# Patient Record
Sex: Female | Born: 1950 | Race: White | Hispanic: No | Marital: Single | Smoking: Never smoker
Health system: Southern US, Community
[De-identification: ages and names within clinical notes are randomized; demographics above are authoritative.]

## PROBLEM LIST (undated history)

## (undated) DIAGNOSIS — I2510 Atherosclerotic heart disease of native coronary artery without angina pectoris: Secondary | ICD-10-CM

## (undated) DIAGNOSIS — I119 Hypertensive heart disease without heart failure: Secondary | ICD-10-CM

## (undated) DIAGNOSIS — Z952 Presence of prosthetic heart valve: Secondary | ICD-10-CM

## (undated) DIAGNOSIS — I10 Essential (primary) hypertension: Secondary | ICD-10-CM

## (undated) DIAGNOSIS — I358 Other nonrheumatic aortic valve disorders: Secondary | ICD-10-CM

## (undated) DIAGNOSIS — F71 Moderate intellectual disabilities: Secondary | ICD-10-CM

## (undated) DIAGNOSIS — I359 Nonrheumatic aortic valve disorder, unspecified: Secondary | ICD-10-CM

## (undated) DIAGNOSIS — M810 Age-related osteoporosis without current pathological fracture: Secondary | ICD-10-CM

## (undated) DIAGNOSIS — E034 Atrophy of thyroid (acquired): Secondary | ICD-10-CM

## (undated) DIAGNOSIS — M8589 Other specified disorders of bone density and structure, multiple sites: Secondary | ICD-10-CM

## (undated) DIAGNOSIS — E785 Hyperlipidemia, unspecified: Secondary | ICD-10-CM

## (undated) DIAGNOSIS — Z951 Presence of aortocoronary bypass graft: Secondary | ICD-10-CM

## (undated) DIAGNOSIS — F329 Major depressive disorder, single episode, unspecified: Secondary | ICD-10-CM

## (undated) DIAGNOSIS — F32A Depression, unspecified: Secondary | ICD-10-CM

## (undated) HISTORY — DX: Atrophy of thyroid (acquired): E03.4

## (undated) HISTORY — DX: Presence of prosthetic heart valve: Z95.2

## (undated) HISTORY — PX: ANKLE SURGERY: SHX546

## (undated) HISTORY — DX: Nonrheumatic aortic valve disorder, unspecified: I35.9

## (undated) HISTORY — DX: Presence of aortocoronary bypass graft: Z95.1

## (undated) HISTORY — PX: ABDOMINAL HYSTERECTOMY: SHX81

## (undated) HISTORY — PX: CORONARY ARTERY BYPASS GRAFT: SHX141

## (undated) HISTORY — DX: Depression, unspecified: F32.A

## (undated) HISTORY — DX: Atherosclerotic heart disease of native coronary artery without angina pectoris: I25.10

## (undated) HISTORY — DX: Other nonrheumatic aortic valve disorders: I35.8

## (undated) HISTORY — DX: Hyperlipidemia, unspecified: E78.5

## (undated) HISTORY — DX: Hypertensive heart disease without heart failure: I11.9

## (undated) HISTORY — DX: Age-related osteoporosis without current pathological fracture: M81.0

## (undated) HISTORY — DX: Other specified disorders of bone density and structure, multiple sites: M85.89

## (undated) HISTORY — DX: Moderate intellectual disabilities: F71

## (undated) HISTORY — DX: Essential (primary) hypertension: I10

## (undated) HISTORY — PX: CARDIAC VALVE REPLACEMENT: SHX585

---
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
## 2001-11-01 DIAGNOSIS — I2510 Atherosclerotic heart disease of native coronary artery without angina pectoris: Secondary | ICD-10-CM

## 2001-11-01 HISTORY — DX: Atherosclerotic heart disease of native coronary artery without angina pectoris: I25.10

## 2011-12-29 DIAGNOSIS — H524 Presbyopia: Secondary | ICD-10-CM | POA: Diagnosis not present

## 2011-12-29 DIAGNOSIS — H2510 Age-related nuclear cataract, unspecified eye: Secondary | ICD-10-CM | POA: Diagnosis not present

## 2012-01-14 DIAGNOSIS — I10 Essential (primary) hypertension: Secondary | ICD-10-CM | POA: Diagnosis not present

## 2012-01-14 DIAGNOSIS — E7800 Pure hypercholesterolemia, unspecified: Secondary | ICD-10-CM | POA: Diagnosis not present

## 2012-01-14 DIAGNOSIS — K5901 Slow transit constipation: Secondary | ICD-10-CM | POA: Diagnosis not present

## 2012-01-26 DIAGNOSIS — J209 Acute bronchitis, unspecified: Secondary | ICD-10-CM | POA: Diagnosis not present

## 2012-03-08 DIAGNOSIS — N959 Unspecified menopausal and perimenopausal disorder: Secondary | ICD-10-CM | POA: Diagnosis not present

## 2012-03-08 DIAGNOSIS — M899 Disorder of bone, unspecified: Secondary | ICD-10-CM | POA: Diagnosis not present

## 2012-03-08 DIAGNOSIS — M949 Disorder of cartilage, unspecified: Secondary | ICD-10-CM | POA: Diagnosis not present

## 2012-04-10 DIAGNOSIS — F39 Unspecified mood [affective] disorder: Secondary | ICD-10-CM | POA: Diagnosis not present

## 2012-04-21 DIAGNOSIS — I10 Essential (primary) hypertension: Secondary | ICD-10-CM | POA: Diagnosis not present

## 2012-04-21 DIAGNOSIS — E7800 Pure hypercholesterolemia, unspecified: Secondary | ICD-10-CM | POA: Diagnosis not present

## 2012-04-21 DIAGNOSIS — E782 Mixed hyperlipidemia: Secondary | ICD-10-CM | POA: Diagnosis not present

## 2012-05-29 DIAGNOSIS — R7402 Elevation of levels of lactic acid dehydrogenase (LDH): Secondary | ICD-10-CM | POA: Diagnosis not present

## 2012-05-29 DIAGNOSIS — Z111 Encounter for screening for respiratory tuberculosis: Secondary | ICD-10-CM | POA: Diagnosis not present

## 2012-05-29 DIAGNOSIS — R7401 Elevation of levels of liver transaminase levels: Secondary | ICD-10-CM | POA: Diagnosis not present

## 2012-05-29 DIAGNOSIS — E038 Other specified hypothyroidism: Secondary | ICD-10-CM | POA: Diagnosis not present

## 2012-05-29 DIAGNOSIS — Z0000 Encounter for general adult medical examination without abnormal findings: Secondary | ICD-10-CM | POA: Diagnosis not present

## 2012-06-01 DIAGNOSIS — I359 Nonrheumatic aortic valve disorder, unspecified: Secondary | ICD-10-CM | POA: Diagnosis not present

## 2012-06-01 DIAGNOSIS — I369 Nonrheumatic tricuspid valve disorder, unspecified: Secondary | ICD-10-CM | POA: Diagnosis not present

## 2012-06-01 DIAGNOSIS — I2789 Other specified pulmonary heart diseases: Secondary | ICD-10-CM | POA: Diagnosis not present

## 2012-06-01 DIAGNOSIS — I079 Rheumatic tricuspid valve disease, unspecified: Secondary | ICD-10-CM | POA: Diagnosis not present

## 2012-06-01 DIAGNOSIS — Z954 Presence of other heart-valve replacement: Secondary | ICD-10-CM | POA: Diagnosis not present

## 2012-06-23 DIAGNOSIS — Z1231 Encounter for screening mammogram for malignant neoplasm of breast: Secondary | ICD-10-CM | POA: Diagnosis not present

## 2012-07-24 DIAGNOSIS — F39 Unspecified mood [affective] disorder: Secondary | ICD-10-CM | POA: Diagnosis not present

## 2012-07-28 DIAGNOSIS — E782 Mixed hyperlipidemia: Secondary | ICD-10-CM | POA: Diagnosis not present

## 2012-07-28 DIAGNOSIS — I10 Essential (primary) hypertension: Secondary | ICD-10-CM | POA: Diagnosis not present

## 2012-07-28 DIAGNOSIS — H9190 Unspecified hearing loss, unspecified ear: Secondary | ICD-10-CM | POA: Diagnosis not present

## 2012-07-28 DIAGNOSIS — E7800 Pure hypercholesterolemia, unspecified: Secondary | ICD-10-CM | POA: Diagnosis not present

## 2012-07-28 DIAGNOSIS — Z23 Encounter for immunization: Secondary | ICD-10-CM | POA: Diagnosis not present

## 2012-07-28 DIAGNOSIS — E038 Other specified hypothyroidism: Secondary | ICD-10-CM | POA: Diagnosis not present

## 2012-08-10 DIAGNOSIS — K089 Disorder of teeth and supporting structures, unspecified: Secondary | ICD-10-CM | POA: Diagnosis not present

## 2012-08-10 DIAGNOSIS — H9190 Unspecified hearing loss, unspecified ear: Secondary | ICD-10-CM | POA: Diagnosis not present

## 2012-08-10 DIAGNOSIS — H6120 Impacted cerumen, unspecified ear: Secondary | ICD-10-CM | POA: Diagnosis not present

## 2012-08-10 DIAGNOSIS — J342 Deviated nasal septum: Secondary | ICD-10-CM | POA: Diagnosis not present

## 2012-08-10 DIAGNOSIS — Z0110 Encounter for examination of ears and hearing without abnormal findings: Secondary | ICD-10-CM | POA: Diagnosis not present

## 2012-08-10 DIAGNOSIS — F79 Unspecified intellectual disabilities: Secondary | ICD-10-CM | POA: Diagnosis not present

## 2012-10-19 DIAGNOSIS — F39 Unspecified mood [affective] disorder: Secondary | ICD-10-CM | POA: Diagnosis not present

## 2012-10-27 DIAGNOSIS — E038 Other specified hypothyroidism: Secondary | ICD-10-CM | POA: Diagnosis not present

## 2012-10-27 DIAGNOSIS — I10 Essential (primary) hypertension: Secondary | ICD-10-CM | POA: Diagnosis not present

## 2012-10-27 DIAGNOSIS — E7800 Pure hypercholesterolemia, unspecified: Secondary | ICD-10-CM | POA: Diagnosis not present

## 2012-10-27 DIAGNOSIS — E782 Mixed hyperlipidemia: Secondary | ICD-10-CM | POA: Diagnosis not present

## 2012-11-07 DIAGNOSIS — J1189 Influenza due to unidentified influenza virus with other manifestations: Secondary | ICD-10-CM | POA: Diagnosis not present

## 2013-01-15 DIAGNOSIS — F39 Unspecified mood [affective] disorder: Secondary | ICD-10-CM | POA: Diagnosis not present

## 2013-01-23 DIAGNOSIS — E701 Other hyperphenylalaninemias: Secondary | ICD-10-CM | POA: Diagnosis not present

## 2013-01-23 DIAGNOSIS — E7800 Pure hypercholesterolemia, unspecified: Secondary | ICD-10-CM | POA: Diagnosis not present

## 2013-01-23 DIAGNOSIS — I10 Essential (primary) hypertension: Secondary | ICD-10-CM | POA: Diagnosis not present

## 2013-01-23 DIAGNOSIS — E700 Classical phenylketonuria: Secondary | ICD-10-CM | POA: Diagnosis not present

## 2013-01-23 DIAGNOSIS — E038 Other specified hypothyroidism: Secondary | ICD-10-CM | POA: Diagnosis not present

## 2013-04-16 DIAGNOSIS — F39 Unspecified mood [affective] disorder: Secondary | ICD-10-CM | POA: Diagnosis not present

## 2013-05-25 DIAGNOSIS — E782 Mixed hyperlipidemia: Secondary | ICD-10-CM | POA: Diagnosis not present

## 2013-05-25 DIAGNOSIS — I10 Essential (primary) hypertension: Secondary | ICD-10-CM | POA: Diagnosis not present

## 2013-05-25 DIAGNOSIS — E038 Other specified hypothyroidism: Secondary | ICD-10-CM | POA: Diagnosis not present

## 2013-05-25 DIAGNOSIS — E7800 Pure hypercholesterolemia, unspecified: Secondary | ICD-10-CM | POA: Diagnosis not present

## 2013-05-31 DIAGNOSIS — Z01419 Encounter for gynecological examination (general) (routine) without abnormal findings: Secondary | ICD-10-CM | POA: Diagnosis not present

## 2013-05-31 DIAGNOSIS — Z0000 Encounter for general adult medical examination without abnormal findings: Secondary | ICD-10-CM | POA: Diagnosis not present

## 2013-06-05 DIAGNOSIS — Z111 Encounter for screening for respiratory tuberculosis: Secondary | ICD-10-CM | POA: Diagnosis not present

## 2013-06-07 DIAGNOSIS — Z9889 Other specified postprocedural states: Secondary | ICD-10-CM | POA: Diagnosis not present

## 2013-06-07 DIAGNOSIS — Z952 Presence of prosthetic heart valve: Secondary | ICD-10-CM | POA: Insufficient documentation

## 2013-06-07 DIAGNOSIS — I369 Nonrheumatic tricuspid valve disorder, unspecified: Secondary | ICD-10-CM | POA: Diagnosis not present

## 2013-06-07 DIAGNOSIS — I359 Nonrheumatic aortic valve disorder, unspecified: Secondary | ICD-10-CM | POA: Diagnosis not present

## 2013-06-07 DIAGNOSIS — Z954 Presence of other heart-valve replacement: Secondary | ICD-10-CM | POA: Diagnosis not present

## 2013-06-25 DIAGNOSIS — Z1231 Encounter for screening mammogram for malignant neoplasm of breast: Secondary | ICD-10-CM | POA: Diagnosis not present

## 2013-08-06 DIAGNOSIS — F329 Major depressive disorder, single episode, unspecified: Secondary | ICD-10-CM | POA: Diagnosis not present

## 2013-08-31 DIAGNOSIS — F71 Moderate intellectual disabilities: Secondary | ICD-10-CM | POA: Diagnosis not present

## 2013-08-31 DIAGNOSIS — Z23 Encounter for immunization: Secondary | ICD-10-CM | POA: Diagnosis not present

## 2013-08-31 DIAGNOSIS — I10 Essential (primary) hypertension: Secondary | ICD-10-CM | POA: Diagnosis not present

## 2013-08-31 DIAGNOSIS — E7800 Pure hypercholesterolemia, unspecified: Secondary | ICD-10-CM | POA: Diagnosis not present

## 2013-08-31 DIAGNOSIS — E038 Other specified hypothyroidism: Secondary | ICD-10-CM | POA: Diagnosis not present

## 2013-08-31 DIAGNOSIS — E782 Mixed hyperlipidemia: Secondary | ICD-10-CM | POA: Diagnosis not present

## 2013-10-05 DIAGNOSIS — J301 Allergic rhinitis due to pollen: Secondary | ICD-10-CM | POA: Diagnosis not present

## 2013-10-12 DIAGNOSIS — A088 Other specified intestinal infections: Secondary | ICD-10-CM | POA: Diagnosis not present

## 2013-10-12 DIAGNOSIS — R112 Nausea with vomiting, unspecified: Secondary | ICD-10-CM | POA: Diagnosis not present

## 2013-12-03 DIAGNOSIS — F329 Major depressive disorder, single episode, unspecified: Secondary | ICD-10-CM | POA: Diagnosis not present

## 2013-12-03 DIAGNOSIS — F3289 Other specified depressive episodes: Secondary | ICD-10-CM | POA: Diagnosis not present

## 2013-12-13 DIAGNOSIS — H2510 Age-related nuclear cataract, unspecified eye: Secondary | ICD-10-CM | POA: Diagnosis not present

## 2014-01-03 DIAGNOSIS — E782 Mixed hyperlipidemia: Secondary | ICD-10-CM | POA: Diagnosis not present

## 2014-01-03 DIAGNOSIS — F71 Moderate intellectual disabilities: Secondary | ICD-10-CM | POA: Diagnosis not present

## 2014-01-03 DIAGNOSIS — I10 Essential (primary) hypertension: Secondary | ICD-10-CM | POA: Diagnosis not present

## 2014-01-03 DIAGNOSIS — E038 Other specified hypothyroidism: Secondary | ICD-10-CM | POA: Diagnosis not present

## 2014-01-03 DIAGNOSIS — E7800 Pure hypercholesterolemia, unspecified: Secondary | ICD-10-CM | POA: Diagnosis not present

## 2014-04-11 DIAGNOSIS — E782 Mixed hyperlipidemia: Secondary | ICD-10-CM | POA: Diagnosis not present

## 2014-04-11 DIAGNOSIS — I10 Essential (primary) hypertension: Secondary | ICD-10-CM | POA: Diagnosis not present

## 2014-04-11 DIAGNOSIS — F71 Moderate intellectual disabilities: Secondary | ICD-10-CM | POA: Diagnosis not present

## 2014-04-11 DIAGNOSIS — E038 Other specified hypothyroidism: Secondary | ICD-10-CM | POA: Diagnosis not present

## 2014-04-29 DIAGNOSIS — F329 Major depressive disorder, single episode, unspecified: Secondary | ICD-10-CM | POA: Diagnosis not present

## 2014-04-29 DIAGNOSIS — F3289 Other specified depressive episodes: Secondary | ICD-10-CM | POA: Diagnosis not present

## 2014-06-06 DIAGNOSIS — Z954 Presence of other heart-valve replacement: Secondary | ICD-10-CM | POA: Diagnosis not present

## 2014-06-06 DIAGNOSIS — I359 Nonrheumatic aortic valve disorder, unspecified: Secondary | ICD-10-CM | POA: Diagnosis not present

## 2014-06-06 DIAGNOSIS — I059 Rheumatic mitral valve disease, unspecified: Secondary | ICD-10-CM | POA: Diagnosis not present

## 2014-06-06 DIAGNOSIS — Z48812 Encounter for surgical aftercare following surgery on the circulatory system: Secondary | ICD-10-CM | POA: Diagnosis not present

## 2014-06-06 DIAGNOSIS — Z9889 Other specified postprocedural states: Secondary | ICD-10-CM | POA: Diagnosis not present

## 2014-07-19 DIAGNOSIS — E038 Other specified hypothyroidism: Secondary | ICD-10-CM | POA: Diagnosis not present

## 2014-07-19 DIAGNOSIS — E782 Mixed hyperlipidemia: Secondary | ICD-10-CM | POA: Diagnosis not present

## 2014-07-19 DIAGNOSIS — Z23 Encounter for immunization: Secondary | ICD-10-CM | POA: Diagnosis not present

## 2014-07-19 DIAGNOSIS — I10 Essential (primary) hypertension: Secondary | ICD-10-CM | POA: Diagnosis not present

## 2014-07-19 DIAGNOSIS — F71 Moderate intellectual disabilities: Secondary | ICD-10-CM | POA: Diagnosis not present

## 2014-08-07 DIAGNOSIS — H905 Unspecified sensorineural hearing loss: Secondary | ICD-10-CM | POA: Diagnosis not present

## 2014-08-07 DIAGNOSIS — J342 Deviated nasal septum: Secondary | ICD-10-CM | POA: Diagnosis not present

## 2014-08-07 DIAGNOSIS — H9190 Unspecified hearing loss, unspecified ear: Secondary | ICD-10-CM | POA: Diagnosis not present

## 2014-08-07 DIAGNOSIS — H903 Sensorineural hearing loss, bilateral: Secondary | ICD-10-CM | POA: Diagnosis not present

## 2014-08-07 DIAGNOSIS — H6123 Impacted cerumen, bilateral: Secondary | ICD-10-CM | POA: Diagnosis not present

## 2014-09-02 DIAGNOSIS — F42 Obsessive-compulsive disorder: Secondary | ICD-10-CM | POA: Diagnosis not present

## 2014-10-01 DIAGNOSIS — N899 Noninflammatory disorder of vagina, unspecified: Secondary | ICD-10-CM | POA: Diagnosis not present

## 2014-11-13 DIAGNOSIS — F71 Moderate intellectual disabilities: Secondary | ICD-10-CM | POA: Diagnosis not present

## 2014-11-13 DIAGNOSIS — E038 Other specified hypothyroidism: Secondary | ICD-10-CM | POA: Diagnosis not present

## 2014-11-13 DIAGNOSIS — Z23 Encounter for immunization: Secondary | ICD-10-CM | POA: Diagnosis not present

## 2014-11-13 DIAGNOSIS — I10 Essential (primary) hypertension: Secondary | ICD-10-CM | POA: Diagnosis not present

## 2014-11-13 DIAGNOSIS — E782 Mixed hyperlipidemia: Secondary | ICD-10-CM | POA: Diagnosis not present

## 2014-11-13 DIAGNOSIS — R7301 Impaired fasting glucose: Secondary | ICD-10-CM | POA: Diagnosis not present

## 2014-11-18 DIAGNOSIS — Z01419 Encounter for gynecological examination (general) (routine) without abnormal findings: Secondary | ICD-10-CM | POA: Diagnosis not present

## 2014-11-18 DIAGNOSIS — Z0000 Encounter for general adult medical examination without abnormal findings: Secondary | ICD-10-CM | POA: Diagnosis not present

## 2014-11-18 DIAGNOSIS — Z111 Encounter for screening for respiratory tuberculosis: Secondary | ICD-10-CM | POA: Diagnosis not present

## 2015-01-03 DIAGNOSIS — Z1231 Encounter for screening mammogram for malignant neoplasm of breast: Secondary | ICD-10-CM | POA: Diagnosis not present

## 2015-01-27 DIAGNOSIS — K644 Residual hemorrhoidal skin tags: Secondary | ICD-10-CM | POA: Diagnosis not present

## 2015-02-03 DIAGNOSIS — F42 Obsessive-compulsive disorder: Secondary | ICD-10-CM | POA: Diagnosis not present

## 2015-02-14 DIAGNOSIS — I10 Essential (primary) hypertension: Secondary | ICD-10-CM | POA: Diagnosis not present

## 2015-02-14 DIAGNOSIS — F71 Moderate intellectual disabilities: Secondary | ICD-10-CM | POA: Diagnosis not present

## 2015-02-14 DIAGNOSIS — E034 Atrophy of thyroid (acquired): Secondary | ICD-10-CM | POA: Diagnosis not present

## 2015-02-14 DIAGNOSIS — E782 Mixed hyperlipidemia: Secondary | ICD-10-CM | POA: Diagnosis not present

## 2015-05-26 DIAGNOSIS — F71 Moderate intellectual disabilities: Secondary | ICD-10-CM | POA: Diagnosis not present

## 2015-05-26 DIAGNOSIS — E034 Atrophy of thyroid (acquired): Secondary | ICD-10-CM | POA: Diagnosis not present

## 2015-05-26 DIAGNOSIS — E782 Mixed hyperlipidemia: Secondary | ICD-10-CM | POA: Diagnosis not present

## 2015-05-26 DIAGNOSIS — I10 Essential (primary) hypertension: Secondary | ICD-10-CM | POA: Diagnosis not present

## 2015-05-28 DIAGNOSIS — H2513 Age-related nuclear cataract, bilateral: Secondary | ICD-10-CM | POA: Diagnosis not present

## 2015-06-12 DIAGNOSIS — Z955 Presence of coronary angioplasty implant and graft: Secondary | ICD-10-CM | POA: Diagnosis not present

## 2015-06-12 DIAGNOSIS — I517 Cardiomegaly: Secondary | ICD-10-CM | POA: Diagnosis not present

## 2015-06-12 DIAGNOSIS — Z952 Presence of prosthetic heart valve: Secondary | ICD-10-CM | POA: Diagnosis not present

## 2015-06-12 DIAGNOSIS — Z79899 Other long term (current) drug therapy: Secondary | ICD-10-CM | POA: Diagnosis not present

## 2015-06-12 DIAGNOSIS — I350 Nonrheumatic aortic (valve) stenosis: Secondary | ICD-10-CM | POA: Diagnosis not present

## 2015-06-12 DIAGNOSIS — Z9889 Other specified postprocedural states: Secondary | ICD-10-CM | POA: Diagnosis not present

## 2015-06-12 DIAGNOSIS — Z7982 Long term (current) use of aspirin: Secondary | ICD-10-CM | POA: Diagnosis not present

## 2015-06-12 DIAGNOSIS — I071 Rheumatic tricuspid insufficiency: Secondary | ICD-10-CM | POA: Diagnosis not present

## 2015-09-01 DIAGNOSIS — I10 Essential (primary) hypertension: Secondary | ICD-10-CM | POA: Diagnosis not present

## 2015-09-01 DIAGNOSIS — E034 Atrophy of thyroid (acquired): Secondary | ICD-10-CM | POA: Diagnosis not present

## 2015-09-01 DIAGNOSIS — E782 Mixed hyperlipidemia: Secondary | ICD-10-CM | POA: Diagnosis not present

## 2015-09-01 DIAGNOSIS — Z23 Encounter for immunization: Secondary | ICD-10-CM | POA: Diagnosis not present

## 2015-09-01 DIAGNOSIS — F71 Moderate intellectual disabilities: Secondary | ICD-10-CM | POA: Diagnosis not present

## 2015-11-13 DIAGNOSIS — J018 Other acute sinusitis: Secondary | ICD-10-CM | POA: Diagnosis not present

## 2015-12-02 DIAGNOSIS — F329 Major depressive disorder, single episode, unspecified: Secondary | ICD-10-CM | POA: Diagnosis not present

## 2015-12-03 DIAGNOSIS — E034 Atrophy of thyroid (acquired): Secondary | ICD-10-CM | POA: Diagnosis not present

## 2015-12-03 DIAGNOSIS — I10 Essential (primary) hypertension: Secondary | ICD-10-CM | POA: Diagnosis not present

## 2015-12-03 DIAGNOSIS — E782 Mixed hyperlipidemia: Secondary | ICD-10-CM | POA: Diagnosis not present

## 2015-12-03 DIAGNOSIS — F71 Moderate intellectual disabilities: Secondary | ICD-10-CM | POA: Diagnosis not present

## 2016-01-10 DIAGNOSIS — S60414A Abrasion of right ring finger, initial encounter: Secondary | ICD-10-CM | POA: Diagnosis not present

## 2016-01-20 DIAGNOSIS — Z0000 Encounter for general adult medical examination without abnormal findings: Secondary | ICD-10-CM | POA: Diagnosis not present

## 2016-01-20 DIAGNOSIS — Z6833 Body mass index (BMI) 33.0-33.9, adult: Secondary | ICD-10-CM | POA: Diagnosis not present

## 2016-01-24 DIAGNOSIS — R079 Chest pain, unspecified: Secondary | ICD-10-CM | POA: Insufficient documentation

## 2016-01-24 DIAGNOSIS — E782 Mixed hyperlipidemia: Secondary | ICD-10-CM | POA: Insufficient documentation

## 2016-01-24 DIAGNOSIS — R109 Unspecified abdominal pain: Secondary | ICD-10-CM | POA: Diagnosis not present

## 2016-01-24 DIAGNOSIS — R072 Precordial pain: Secondary | ICD-10-CM | POA: Diagnosis not present

## 2016-01-24 DIAGNOSIS — R110 Nausea: Secondary | ICD-10-CM | POA: Diagnosis not present

## 2016-01-24 DIAGNOSIS — B9620 Unspecified Escherichia coli [E. coli] as the cause of diseases classified elsewhere: Secondary | ICD-10-CM | POA: Diagnosis not present

## 2016-01-24 DIAGNOSIS — Z79899 Other long term (current) drug therapy: Secondary | ICD-10-CM | POA: Diagnosis not present

## 2016-01-24 DIAGNOSIS — F32A Depression, unspecified: Secondary | ICD-10-CM | POA: Insufficient documentation

## 2016-01-24 DIAGNOSIS — B964 Proteus (mirabilis) (morganii) as the cause of diseases classified elsewhere: Secondary | ICD-10-CM | POA: Diagnosis not present

## 2016-01-24 DIAGNOSIS — I10 Essential (primary) hypertension: Secondary | ICD-10-CM | POA: Diagnosis not present

## 2016-01-24 DIAGNOSIS — R1013 Epigastric pain: Secondary | ICD-10-CM | POA: Diagnosis not present

## 2016-01-24 DIAGNOSIS — R0789 Other chest pain: Secondary | ICD-10-CM | POA: Diagnosis present

## 2016-01-24 DIAGNOSIS — B349 Viral infection, unspecified: Secondary | ICD-10-CM | POA: Diagnosis not present

## 2016-01-24 DIAGNOSIS — E860 Dehydration: Secondary | ICD-10-CM | POA: Diagnosis present

## 2016-01-24 DIAGNOSIS — E039 Hypothyroidism, unspecified: Secondary | ICD-10-CM | POA: Insufficient documentation

## 2016-01-24 DIAGNOSIS — I119 Hypertensive heart disease without heart failure: Secondary | ICD-10-CM | POA: Insufficient documentation

## 2016-01-24 DIAGNOSIS — N390 Urinary tract infection, site not specified: Secondary | ICD-10-CM | POA: Diagnosis not present

## 2016-01-24 DIAGNOSIS — E785 Hyperlipidemia, unspecified: Secondary | ICD-10-CM | POA: Diagnosis not present

## 2016-01-24 DIAGNOSIS — K2970 Gastritis, unspecified, without bleeding: Secondary | ICD-10-CM | POA: Diagnosis not present

## 2016-01-24 DIAGNOSIS — F329 Major depressive disorder, single episode, unspecified: Secondary | ICD-10-CM | POA: Insufficient documentation

## 2016-01-24 DIAGNOSIS — Z7983 Long term (current) use of bisphosphonates: Secondary | ICD-10-CM | POA: Diagnosis not present

## 2016-01-24 DIAGNOSIS — J90 Pleural effusion, not elsewhere classified: Secondary | ICD-10-CM | POA: Diagnosis not present

## 2016-01-24 DIAGNOSIS — R112 Nausea with vomiting, unspecified: Secondary | ICD-10-CM | POA: Diagnosis not present

## 2016-01-24 DIAGNOSIS — K760 Fatty (change of) liver, not elsewhere classified: Secondary | ICD-10-CM | POA: Diagnosis not present

## 2016-01-24 DIAGNOSIS — I361 Nonrheumatic tricuspid (valve) insufficiency: Secondary | ICD-10-CM | POA: Diagnosis not present

## 2016-01-24 DIAGNOSIS — I272 Other secondary pulmonary hypertension: Secondary | ICD-10-CM | POA: Diagnosis not present

## 2016-01-24 DIAGNOSIS — F79 Unspecified intellectual disabilities: Secondary | ICD-10-CM | POA: Diagnosis present

## 2016-01-24 DIAGNOSIS — Z7982 Long term (current) use of aspirin: Secondary | ICD-10-CM | POA: Diagnosis not present

## 2016-01-24 DIAGNOSIS — R1011 Right upper quadrant pain: Secondary | ICD-10-CM | POA: Diagnosis not present

## 2016-02-02 DIAGNOSIS — Z111 Encounter for screening for respiratory tuberculosis: Secondary | ICD-10-CM | POA: Diagnosis not present

## 2016-02-02 DIAGNOSIS — N3000 Acute cystitis without hematuria: Secondary | ICD-10-CM | POA: Diagnosis not present

## 2016-02-24 DIAGNOSIS — F422 Mixed obsessional thoughts and acts: Secondary | ICD-10-CM | POA: Diagnosis not present

## 2016-03-04 DIAGNOSIS — R7301 Impaired fasting glucose: Secondary | ICD-10-CM | POA: Diagnosis not present

## 2016-03-04 DIAGNOSIS — F71 Moderate intellectual disabilities: Secondary | ICD-10-CM | POA: Diagnosis not present

## 2016-03-04 DIAGNOSIS — E034 Atrophy of thyroid (acquired): Secondary | ICD-10-CM | POA: Diagnosis not present

## 2016-03-04 DIAGNOSIS — E782 Mixed hyperlipidemia: Secondary | ICD-10-CM | POA: Diagnosis not present

## 2016-03-04 DIAGNOSIS — I10 Essential (primary) hypertension: Secondary | ICD-10-CM | POA: Diagnosis not present

## 2016-04-06 DIAGNOSIS — Z1231 Encounter for screening mammogram for malignant neoplasm of breast: Secondary | ICD-10-CM | POA: Diagnosis not present

## 2016-06-09 DIAGNOSIS — F71 Moderate intellectual disabilities: Secondary | ICD-10-CM | POA: Diagnosis not present

## 2016-06-09 DIAGNOSIS — E034 Atrophy of thyroid (acquired): Secondary | ICD-10-CM | POA: Diagnosis not present

## 2016-06-09 DIAGNOSIS — I10 Essential (primary) hypertension: Secondary | ICD-10-CM | POA: Diagnosis not present

## 2016-06-09 DIAGNOSIS — E782 Mixed hyperlipidemia: Secondary | ICD-10-CM | POA: Diagnosis not present

## 2016-06-09 DIAGNOSIS — M859 Disorder of bone density and structure, unspecified: Secondary | ICD-10-CM | POA: Diagnosis not present

## 2016-06-10 DIAGNOSIS — Z952 Presence of prosthetic heart valve: Secondary | ICD-10-CM | POA: Diagnosis not present

## 2016-06-10 DIAGNOSIS — I361 Nonrheumatic tricuspid (valve) insufficiency: Secondary | ICD-10-CM | POA: Diagnosis not present

## 2016-06-10 DIAGNOSIS — Z951 Presence of aortocoronary bypass graft: Secondary | ICD-10-CM | POA: Diagnosis not present

## 2016-06-10 DIAGNOSIS — Z9889 Other specified postprocedural states: Secondary | ICD-10-CM | POA: Diagnosis not present

## 2016-06-10 DIAGNOSIS — Z6830 Body mass index (BMI) 30.0-30.9, adult: Secondary | ICD-10-CM | POA: Diagnosis not present

## 2016-06-10 DIAGNOSIS — Z7901 Long term (current) use of anticoagulants: Secondary | ICD-10-CM | POA: Diagnosis not present

## 2016-06-10 DIAGNOSIS — Z7982 Long term (current) use of aspirin: Secondary | ICD-10-CM | POA: Diagnosis not present

## 2016-06-10 DIAGNOSIS — I340 Nonrheumatic mitral (valve) insufficiency: Secondary | ICD-10-CM | POA: Diagnosis not present

## 2016-06-10 DIAGNOSIS — Z79899 Other long term (current) drug therapy: Secondary | ICD-10-CM | POA: Diagnosis not present

## 2016-06-25 ENCOUNTER — Other Ambulatory Visit: Payer: Self-pay

## 2016-06-25 DIAGNOSIS — M85862 Other specified disorders of bone density and structure, left lower leg: Secondary | ICD-10-CM | POA: Diagnosis not present

## 2016-06-25 DIAGNOSIS — M859 Disorder of bone density and structure, unspecified: Secondary | ICD-10-CM | POA: Diagnosis not present

## 2016-07-27 DIAGNOSIS — F423 Hoarding disorder: Secondary | ICD-10-CM | POA: Diagnosis not present

## 2016-09-14 DIAGNOSIS — Z23 Encounter for immunization: Secondary | ICD-10-CM | POA: Diagnosis not present

## 2016-09-14 DIAGNOSIS — I10 Essential (primary) hypertension: Secondary | ICD-10-CM | POA: Diagnosis not present

## 2016-09-14 DIAGNOSIS — E782 Mixed hyperlipidemia: Secondary | ICD-10-CM | POA: Diagnosis not present

## 2016-09-14 DIAGNOSIS — F71 Moderate intellectual disabilities: Secondary | ICD-10-CM | POA: Diagnosis not present

## 2016-09-14 DIAGNOSIS — E034 Atrophy of thyroid (acquired): Secondary | ICD-10-CM | POA: Diagnosis not present

## 2016-09-16 DIAGNOSIS — E034 Atrophy of thyroid (acquired): Secondary | ICD-10-CM | POA: Diagnosis not present

## 2016-09-16 DIAGNOSIS — I10 Essential (primary) hypertension: Secondary | ICD-10-CM | POA: Diagnosis not present

## 2016-09-16 DIAGNOSIS — E782 Mixed hyperlipidemia: Secondary | ICD-10-CM | POA: Diagnosis not present

## 2016-10-18 DIAGNOSIS — F422 Mixed obsessional thoughts and acts: Secondary | ICD-10-CM | POA: Diagnosis not present

## 2016-12-17 DIAGNOSIS — F71 Moderate intellectual disabilities: Secondary | ICD-10-CM | POA: Diagnosis not present

## 2016-12-17 DIAGNOSIS — I358 Other nonrheumatic aortic valve disorders: Secondary | ICD-10-CM | POA: Diagnosis not present

## 2016-12-17 DIAGNOSIS — E034 Atrophy of thyroid (acquired): Secondary | ICD-10-CM | POA: Diagnosis not present

## 2016-12-17 DIAGNOSIS — I10 Essential (primary) hypertension: Secondary | ICD-10-CM | POA: Diagnosis not present

## 2016-12-17 DIAGNOSIS — E782 Mixed hyperlipidemia: Secondary | ICD-10-CM | POA: Diagnosis not present

## 2017-01-19 DIAGNOSIS — Z6833 Body mass index (BMI) 33.0-33.9, adult: Secondary | ICD-10-CM | POA: Diagnosis not present

## 2017-01-19 DIAGNOSIS — Z0110 Encounter for examination of ears and hearing without abnormal findings: Secondary | ICD-10-CM | POA: Diagnosis not present

## 2017-01-19 DIAGNOSIS — Z0100 Encounter for examination of eyes and vision without abnormal findings: Secondary | ICD-10-CM | POA: Diagnosis not present

## 2017-01-19 DIAGNOSIS — Z0000 Encounter for general adult medical examination without abnormal findings: Secondary | ICD-10-CM | POA: Diagnosis not present

## 2017-02-07 DIAGNOSIS — F329 Major depressive disorder, single episode, unspecified: Secondary | ICD-10-CM | POA: Diagnosis not present

## 2017-03-25 DIAGNOSIS — F71 Moderate intellectual disabilities: Secondary | ICD-10-CM | POA: Diagnosis not present

## 2017-03-25 DIAGNOSIS — E034 Atrophy of thyroid (acquired): Secondary | ICD-10-CM | POA: Diagnosis not present

## 2017-03-25 DIAGNOSIS — I10 Essential (primary) hypertension: Secondary | ICD-10-CM | POA: Diagnosis not present

## 2017-03-25 DIAGNOSIS — Z111 Encounter for screening for respiratory tuberculosis: Secondary | ICD-10-CM | POA: Diagnosis not present

## 2017-03-25 DIAGNOSIS — E782 Mixed hyperlipidemia: Secondary | ICD-10-CM | POA: Diagnosis not present

## 2017-03-25 DIAGNOSIS — Z1231 Encounter for screening mammogram for malignant neoplasm of breast: Secondary | ICD-10-CM | POA: Diagnosis not present

## 2017-04-04 DIAGNOSIS — F422 Mixed obsessional thoughts and acts: Secondary | ICD-10-CM | POA: Diagnosis not present

## 2017-04-15 DIAGNOSIS — Z1231 Encounter for screening mammogram for malignant neoplasm of breast: Secondary | ICD-10-CM | POA: Diagnosis not present

## 2017-06-29 DIAGNOSIS — I10 Essential (primary) hypertension: Secondary | ICD-10-CM | POA: Diagnosis not present

## 2017-06-29 DIAGNOSIS — E782 Mixed hyperlipidemia: Secondary | ICD-10-CM | POA: Diagnosis not present

## 2017-06-29 DIAGNOSIS — F71 Moderate intellectual disabilities: Secondary | ICD-10-CM | POA: Diagnosis not present

## 2017-06-29 DIAGNOSIS — E034 Atrophy of thyroid (acquired): Secondary | ICD-10-CM | POA: Diagnosis not present

## 2017-06-29 DIAGNOSIS — I358 Other nonrheumatic aortic valve disorders: Secondary | ICD-10-CM | POA: Diagnosis not present

## 2017-07-11 DIAGNOSIS — F422 Mixed obsessional thoughts and acts: Secondary | ICD-10-CM | POA: Diagnosis not present

## 2017-07-21 DIAGNOSIS — Z951 Presence of aortocoronary bypass graft: Secondary | ICD-10-CM | POA: Diagnosis not present

## 2017-07-21 DIAGNOSIS — I361 Nonrheumatic tricuspid (valve) insufficiency: Secondary | ICD-10-CM | POA: Diagnosis not present

## 2017-07-21 DIAGNOSIS — Z952 Presence of prosthetic heart valve: Secondary | ICD-10-CM | POA: Diagnosis not present

## 2017-07-21 DIAGNOSIS — Z9889 Other specified postprocedural states: Secondary | ICD-10-CM | POA: Diagnosis not present

## 2017-07-21 DIAGNOSIS — I77810 Thoracic aortic ectasia: Secondary | ICD-10-CM | POA: Diagnosis not present

## 2017-07-21 DIAGNOSIS — I071 Rheumatic tricuspid insufficiency: Secondary | ICD-10-CM | POA: Diagnosis not present

## 2017-08-04 DIAGNOSIS — J00 Acute nasopharyngitis [common cold]: Secondary | ICD-10-CM | POA: Diagnosis not present

## 2017-09-14 DIAGNOSIS — Z23 Encounter for immunization: Secondary | ICD-10-CM | POA: Diagnosis not present

## 2017-10-03 DIAGNOSIS — F423 Hoarding disorder: Secondary | ICD-10-CM | POA: Diagnosis not present

## 2017-10-05 DIAGNOSIS — E782 Mixed hyperlipidemia: Secondary | ICD-10-CM | POA: Diagnosis not present

## 2017-10-05 DIAGNOSIS — E034 Atrophy of thyroid (acquired): Secondary | ICD-10-CM | POA: Diagnosis not present

## 2017-10-05 DIAGNOSIS — F71 Moderate intellectual disabilities: Secondary | ICD-10-CM | POA: Diagnosis not present

## 2017-10-05 DIAGNOSIS — I358 Other nonrheumatic aortic valve disorders: Secondary | ICD-10-CM | POA: Diagnosis not present

## 2017-10-05 DIAGNOSIS — I10 Essential (primary) hypertension: Secondary | ICD-10-CM | POA: Diagnosis not present

## 2017-10-28 DIAGNOSIS — J018 Other acute sinusitis: Secondary | ICD-10-CM | POA: Diagnosis not present

## 2017-12-22 DIAGNOSIS — J018 Other acute sinusitis: Secondary | ICD-10-CM | POA: Diagnosis not present

## 2018-01-02 DIAGNOSIS — F422 Mixed obsessional thoughts and acts: Secondary | ICD-10-CM | POA: Diagnosis not present

## 2018-01-05 DIAGNOSIS — F71 Moderate intellectual disabilities: Secondary | ICD-10-CM | POA: Diagnosis not present

## 2018-01-05 DIAGNOSIS — Z6830 Body mass index (BMI) 30.0-30.9, adult: Secondary | ICD-10-CM | POA: Diagnosis not present

## 2018-01-05 DIAGNOSIS — E034 Atrophy of thyroid (acquired): Secondary | ICD-10-CM | POA: Diagnosis not present

## 2018-01-05 DIAGNOSIS — I358 Other nonrheumatic aortic valve disorders: Secondary | ICD-10-CM | POA: Diagnosis not present

## 2018-01-05 DIAGNOSIS — E782 Mixed hyperlipidemia: Secondary | ICD-10-CM | POA: Diagnosis not present

## 2018-01-05 DIAGNOSIS — E6609 Other obesity due to excess calories: Secondary | ICD-10-CM | POA: Diagnosis not present

## 2018-01-05 DIAGNOSIS — I10 Essential (primary) hypertension: Secondary | ICD-10-CM | POA: Diagnosis not present

## 2018-01-06 DIAGNOSIS — E034 Atrophy of thyroid (acquired): Secondary | ICD-10-CM | POA: Diagnosis not present

## 2018-01-06 DIAGNOSIS — I10 Essential (primary) hypertension: Secondary | ICD-10-CM | POA: Diagnosis not present

## 2018-01-06 DIAGNOSIS — R740 Nonspecific elevation of levels of transaminase and lactic acid dehydrogenase [LDH]: Secondary | ICD-10-CM | POA: Diagnosis not present

## 2018-01-06 DIAGNOSIS — E782 Mixed hyperlipidemia: Secondary | ICD-10-CM | POA: Diagnosis not present

## 2018-01-19 DIAGNOSIS — R51 Headache: Secondary | ICD-10-CM | POA: Diagnosis not present

## 2018-01-19 DIAGNOSIS — S0990XA Unspecified injury of head, initial encounter: Secondary | ICD-10-CM | POA: Diagnosis not present

## 2018-01-26 DIAGNOSIS — R945 Abnormal results of liver function studies: Secondary | ICD-10-CM | POA: Diagnosis not present

## 2018-01-30 DIAGNOSIS — R740 Nonspecific elevation of levels of transaminase and lactic acid dehydrogenase [LDH]: Secondary | ICD-10-CM | POA: Diagnosis not present

## 2018-02-15 DIAGNOSIS — Z1231 Encounter for screening mammogram for malignant neoplasm of breast: Secondary | ICD-10-CM | POA: Diagnosis not present

## 2018-02-15 DIAGNOSIS — Z6830 Body mass index (BMI) 30.0-30.9, adult: Secondary | ICD-10-CM | POA: Diagnosis not present

## 2018-02-15 DIAGNOSIS — Z0000 Encounter for general adult medical examination without abnormal findings: Secondary | ICD-10-CM | POA: Diagnosis not present

## 2018-02-20 DIAGNOSIS — E039 Hypothyroidism, unspecified: Secondary | ICD-10-CM | POA: Diagnosis not present

## 2018-02-20 DIAGNOSIS — E871 Hypo-osmolality and hyponatremia: Secondary | ICD-10-CM | POA: Diagnosis not present

## 2018-02-20 DIAGNOSIS — R945 Abnormal results of liver function studies: Secondary | ICD-10-CM | POA: Diagnosis not present

## 2018-02-20 DIAGNOSIS — K5909 Other constipation: Secondary | ICD-10-CM | POA: Diagnosis not present

## 2018-02-20 DIAGNOSIS — E7800 Pure hypercholesterolemia, unspecified: Secondary | ICD-10-CM | POA: Diagnosis not present

## 2018-02-20 DIAGNOSIS — E878 Other disorders of electrolyte and fluid balance, not elsewhere classified: Secondary | ICD-10-CM | POA: Diagnosis not present

## 2018-03-06 DIAGNOSIS — R945 Abnormal results of liver function studies: Secondary | ICD-10-CM | POA: Diagnosis not present

## 2018-03-06 DIAGNOSIS — K5909 Other constipation: Secondary | ICD-10-CM | POA: Diagnosis not present

## 2018-04-03 DIAGNOSIS — K5900 Constipation, unspecified: Secondary | ICD-10-CM | POA: Diagnosis not present

## 2018-04-03 DIAGNOSIS — R945 Abnormal results of liver function studies: Secondary | ICD-10-CM | POA: Diagnosis not present

## 2018-04-28 DIAGNOSIS — Z111 Encounter for screening for respiratory tuberculosis: Secondary | ICD-10-CM | POA: Diagnosis not present

## 2018-04-28 DIAGNOSIS — E034 Atrophy of thyroid (acquired): Secondary | ICD-10-CM | POA: Diagnosis not present

## 2018-04-28 DIAGNOSIS — I10 Essential (primary) hypertension: Secondary | ICD-10-CM | POA: Diagnosis not present

## 2018-04-28 DIAGNOSIS — E782 Mixed hyperlipidemia: Secondary | ICD-10-CM | POA: Diagnosis not present

## 2018-04-28 DIAGNOSIS — F71 Moderate intellectual disabilities: Secondary | ICD-10-CM | POA: Diagnosis not present

## 2018-04-28 DIAGNOSIS — I358 Other nonrheumatic aortic valve disorders: Secondary | ICD-10-CM | POA: Diagnosis not present

## 2018-05-08 DIAGNOSIS — F422 Mixed obsessional thoughts and acts: Secondary | ICD-10-CM | POA: Diagnosis not present

## 2018-06-01 DIAGNOSIS — H2513 Age-related nuclear cataract, bilateral: Secondary | ICD-10-CM | POA: Diagnosis not present

## 2018-07-17 DIAGNOSIS — R945 Abnormal results of liver function studies: Secondary | ICD-10-CM | POA: Diagnosis not present

## 2018-07-17 DIAGNOSIS — E7800 Pure hypercholesterolemia, unspecified: Secondary | ICD-10-CM | POA: Diagnosis not present

## 2018-07-17 DIAGNOSIS — K5909 Other constipation: Secondary | ICD-10-CM | POA: Diagnosis not present

## 2018-07-17 DIAGNOSIS — I10 Essential (primary) hypertension: Secondary | ICD-10-CM | POA: Diagnosis not present

## 2018-07-27 DIAGNOSIS — I071 Rheumatic tricuspid insufficiency: Secondary | ICD-10-CM | POA: Diagnosis not present

## 2018-07-27 DIAGNOSIS — Z951 Presence of aortocoronary bypass graft: Secondary | ICD-10-CM | POA: Diagnosis not present

## 2018-07-27 DIAGNOSIS — Z9889 Other specified postprocedural states: Secondary | ICD-10-CM | POA: Diagnosis not present

## 2018-07-27 DIAGNOSIS — Z952 Presence of prosthetic heart valve: Secondary | ICD-10-CM | POA: Diagnosis not present

## 2018-07-31 DIAGNOSIS — I10 Essential (primary) hypertension: Secondary | ICD-10-CM | POA: Diagnosis not present

## 2018-07-31 DIAGNOSIS — Z1231 Encounter for screening mammogram for malignant neoplasm of breast: Secondary | ICD-10-CM | POA: Diagnosis not present

## 2018-07-31 DIAGNOSIS — Z1211 Encounter for screening for malignant neoplasm of colon: Secondary | ICD-10-CM | POA: Diagnosis not present

## 2018-07-31 DIAGNOSIS — E034 Atrophy of thyroid (acquired): Secondary | ICD-10-CM | POA: Diagnosis not present

## 2018-07-31 DIAGNOSIS — F71 Moderate intellectual disabilities: Secondary | ICD-10-CM | POA: Diagnosis not present

## 2018-07-31 DIAGNOSIS — E782 Mixed hyperlipidemia: Secondary | ICD-10-CM | POA: Diagnosis not present

## 2018-07-31 DIAGNOSIS — Z23 Encounter for immunization: Secondary | ICD-10-CM | POA: Diagnosis not present

## 2018-07-31 DIAGNOSIS — I358 Other nonrheumatic aortic valve disorders: Secondary | ICD-10-CM | POA: Diagnosis not present

## 2018-08-09 DIAGNOSIS — I071 Rheumatic tricuspid insufficiency: Secondary | ICD-10-CM | POA: Insufficient documentation

## 2018-08-23 DIAGNOSIS — I071 Rheumatic tricuspid insufficiency: Secondary | ICD-10-CM | POA: Diagnosis not present

## 2018-08-23 DIAGNOSIS — Z789 Other specified health status: Secondary | ICD-10-CM | POA: Insufficient documentation

## 2018-08-23 DIAGNOSIS — I2510 Atherosclerotic heart disease of native coronary artery without angina pectoris: Secondary | ICD-10-CM | POA: Diagnosis not present

## 2018-08-23 DIAGNOSIS — I25118 Atherosclerotic heart disease of native coronary artery with other forms of angina pectoris: Secondary | ICD-10-CM | POA: Insufficient documentation

## 2018-08-23 DIAGNOSIS — Z952 Presence of prosthetic heart valve: Secondary | ICD-10-CM | POA: Diagnosis not present

## 2018-08-23 DIAGNOSIS — Z9889 Other specified postprocedural states: Secondary | ICD-10-CM | POA: Diagnosis not present

## 2018-08-23 DIAGNOSIS — Z951 Presence of aortocoronary bypass graft: Secondary | ICD-10-CM | POA: Diagnosis not present

## 2018-08-23 DIAGNOSIS — E785 Hyperlipidemia, unspecified: Secondary | ICD-10-CM | POA: Diagnosis not present

## 2018-08-24 DIAGNOSIS — Z1231 Encounter for screening mammogram for malignant neoplasm of breast: Secondary | ICD-10-CM | POA: Diagnosis not present

## 2018-09-04 DIAGNOSIS — F422 Mixed obsessional thoughts and acts: Secondary | ICD-10-CM | POA: Diagnosis not present

## 2018-09-18 ENCOUNTER — Other Ambulatory Visit: Payer: Self-pay

## 2018-09-20 DIAGNOSIS — J00 Acute nasopharyngitis [common cold]: Secondary | ICD-10-CM | POA: Diagnosis not present

## 2018-10-16 DIAGNOSIS — I10 Essential (primary) hypertension: Secondary | ICD-10-CM | POA: Diagnosis not present

## 2018-10-16 DIAGNOSIS — R945 Abnormal results of liver function studies: Secondary | ICD-10-CM | POA: Diagnosis not present

## 2018-10-16 DIAGNOSIS — K5909 Other constipation: Secondary | ICD-10-CM | POA: Diagnosis not present

## 2018-10-16 DIAGNOSIS — Z79899 Other long term (current) drug therapy: Secondary | ICD-10-CM | POA: Diagnosis not present

## 2018-11-08 DIAGNOSIS — I119 Hypertensive heart disease without heart failure: Secondary | ICD-10-CM | POA: Diagnosis not present

## 2018-11-08 DIAGNOSIS — F71 Moderate intellectual disabilities: Secondary | ICD-10-CM | POA: Diagnosis not present

## 2018-11-08 DIAGNOSIS — I358 Other nonrheumatic aortic valve disorders: Secondary | ICD-10-CM | POA: Diagnosis not present

## 2018-11-08 DIAGNOSIS — G720 Drug-induced myopathy: Secondary | ICD-10-CM | POA: Diagnosis not present

## 2018-11-08 DIAGNOSIS — I2510 Atherosclerotic heart disease of native coronary artery without angina pectoris: Secondary | ICD-10-CM | POA: Diagnosis not present

## 2018-11-08 DIAGNOSIS — E782 Mixed hyperlipidemia: Secondary | ICD-10-CM | POA: Diagnosis not present

## 2018-11-08 DIAGNOSIS — E034 Atrophy of thyroid (acquired): Secondary | ICD-10-CM | POA: Diagnosis not present

## 2018-11-08 DIAGNOSIS — Z951 Presence of aortocoronary bypass graft: Secondary | ICD-10-CM | POA: Diagnosis not present

## 2018-11-08 DIAGNOSIS — Z952 Presence of prosthetic heart valve: Secondary | ICD-10-CM | POA: Diagnosis not present

## 2019-01-15 DIAGNOSIS — R21 Rash and other nonspecific skin eruption: Secondary | ICD-10-CM | POA: Diagnosis not present

## 2019-01-23 DIAGNOSIS — F422 Mixed obsessional thoughts and acts: Secondary | ICD-10-CM | POA: Diagnosis not present

## 2019-01-29 DIAGNOSIS — R21 Rash and other nonspecific skin eruption: Secondary | ICD-10-CM | POA: Diagnosis not present

## 2019-02-21 DIAGNOSIS — Z0000 Encounter for general adult medical examination without abnormal findings: Secondary | ICD-10-CM | POA: Diagnosis not present

## 2019-02-21 DIAGNOSIS — E782 Mixed hyperlipidemia: Secondary | ICD-10-CM | POA: Diagnosis not present

## 2019-02-21 DIAGNOSIS — Z111 Encounter for screening for respiratory tuberculosis: Secondary | ICD-10-CM | POA: Diagnosis not present

## 2019-02-21 DIAGNOSIS — Z6829 Body mass index (BMI) 29.0-29.9, adult: Secondary | ICD-10-CM | POA: Diagnosis not present

## 2019-02-21 DIAGNOSIS — E034 Atrophy of thyroid (acquired): Secondary | ICD-10-CM | POA: Diagnosis not present

## 2019-03-06 DIAGNOSIS — R21 Rash and other nonspecific skin eruption: Secondary | ICD-10-CM | POA: Diagnosis not present

## 2019-04-02 DIAGNOSIS — B354 Tinea corporis: Secondary | ICD-10-CM | POA: Diagnosis not present

## 2019-04-23 DIAGNOSIS — L299 Pruritus, unspecified: Secondary | ICD-10-CM | POA: Diagnosis not present

## 2019-04-23 DIAGNOSIS — L309 Dermatitis, unspecified: Secondary | ICD-10-CM | POA: Diagnosis not present

## 2019-04-23 DIAGNOSIS — C44319 Basal cell carcinoma of skin of other parts of face: Secondary | ICD-10-CM | POA: Diagnosis not present

## 2019-05-09 DIAGNOSIS — C441191 Basal cell carcinoma of skin of left upper eyelid, including canthus: Secondary | ICD-10-CM | POA: Diagnosis not present

## 2019-05-30 DIAGNOSIS — E034 Atrophy of thyroid (acquired): Secondary | ICD-10-CM | POA: Diagnosis not present

## 2019-05-30 DIAGNOSIS — I358 Other nonrheumatic aortic valve disorders: Secondary | ICD-10-CM | POA: Diagnosis not present

## 2019-05-30 DIAGNOSIS — I2510 Atherosclerotic heart disease of native coronary artery without angina pectoris: Secondary | ICD-10-CM | POA: Diagnosis not present

## 2019-05-30 DIAGNOSIS — Z951 Presence of aortocoronary bypass graft: Secondary | ICD-10-CM | POA: Diagnosis not present

## 2019-05-30 DIAGNOSIS — F71 Moderate intellectual disabilities: Secondary | ICD-10-CM | POA: Diagnosis not present

## 2019-05-30 DIAGNOSIS — E782 Mixed hyperlipidemia: Secondary | ICD-10-CM | POA: Diagnosis not present

## 2019-05-30 DIAGNOSIS — Z952 Presence of prosthetic heart valve: Secondary | ICD-10-CM | POA: Diagnosis not present

## 2019-05-30 DIAGNOSIS — G720 Drug-induced myopathy: Secondary | ICD-10-CM | POA: Diagnosis not present

## 2019-05-30 DIAGNOSIS — I119 Hypertensive heart disease without heart failure: Secondary | ICD-10-CM | POA: Diagnosis not present

## 2019-06-12 DIAGNOSIS — F422 Mixed obsessional thoughts and acts: Secondary | ICD-10-CM | POA: Diagnosis not present

## 2019-08-13 DIAGNOSIS — Z20828 Contact with and (suspected) exposure to other viral communicable diseases: Secondary | ICD-10-CM | POA: Diagnosis not present

## 2019-08-31 DIAGNOSIS — E034 Atrophy of thyroid (acquired): Secondary | ICD-10-CM | POA: Diagnosis not present

## 2019-08-31 DIAGNOSIS — G720 Drug-induced myopathy: Secondary | ICD-10-CM | POA: Diagnosis not present

## 2019-08-31 DIAGNOSIS — M8589 Other specified disorders of bone density and structure, multiple sites: Secondary | ICD-10-CM | POA: Diagnosis not present

## 2019-08-31 DIAGNOSIS — I358 Other nonrheumatic aortic valve disorders: Secondary | ICD-10-CM | POA: Diagnosis not present

## 2019-08-31 DIAGNOSIS — Z952 Presence of prosthetic heart valve: Secondary | ICD-10-CM | POA: Diagnosis not present

## 2019-08-31 DIAGNOSIS — Z1231 Encounter for screening mammogram for malignant neoplasm of breast: Secondary | ICD-10-CM | POA: Diagnosis not present

## 2019-08-31 DIAGNOSIS — Z951 Presence of aortocoronary bypass graft: Secondary | ICD-10-CM | POA: Diagnosis not present

## 2019-08-31 DIAGNOSIS — I119 Hypertensive heart disease without heart failure: Secondary | ICD-10-CM | POA: Diagnosis not present

## 2019-08-31 DIAGNOSIS — I2510 Atherosclerotic heart disease of native coronary artery without angina pectoris: Secondary | ICD-10-CM | POA: Diagnosis not present

## 2019-08-31 DIAGNOSIS — Z23 Encounter for immunization: Secondary | ICD-10-CM | POA: Diagnosis not present

## 2019-08-31 DIAGNOSIS — F71 Moderate intellectual disabilities: Secondary | ICD-10-CM | POA: Diagnosis not present

## 2019-08-31 DIAGNOSIS — E782 Mixed hyperlipidemia: Secondary | ICD-10-CM | POA: Diagnosis not present

## 2019-10-11 DIAGNOSIS — Z20828 Contact with and (suspected) exposure to other viral communicable diseases: Secondary | ICD-10-CM | POA: Diagnosis not present

## 2019-11-05 DIAGNOSIS — Z20828 Contact with and (suspected) exposure to other viral communicable diseases: Secondary | ICD-10-CM | POA: Diagnosis not present

## 2019-11-21 DIAGNOSIS — L578 Other skin changes due to chronic exposure to nonionizing radiation: Secondary | ICD-10-CM | POA: Diagnosis not present

## 2019-11-21 DIAGNOSIS — C44319 Basal cell carcinoma of skin of other parts of face: Secondary | ICD-10-CM | POA: Diagnosis not present

## 2019-12-05 ENCOUNTER — Ambulatory Visit (INDEPENDENT_AMBULATORY_CARE_PROVIDER_SITE_OTHER): Payer: Medicare Other | Admitting: Family Medicine

## 2019-12-05 ENCOUNTER — Encounter: Payer: Self-pay | Admitting: Family Medicine

## 2019-12-05 ENCOUNTER — Other Ambulatory Visit: Payer: Self-pay

## 2019-12-05 VITALS — BP 130/78 | HR 88 | Temp 97.7°F | Resp 18 | Wt 178.4 lb

## 2019-12-05 DIAGNOSIS — R0789 Other chest pain: Secondary | ICD-10-CM

## 2019-12-05 DIAGNOSIS — E038 Other specified hypothyroidism: Secondary | ICD-10-CM | POA: Diagnosis not present

## 2019-12-05 DIAGNOSIS — F330 Major depressive disorder, recurrent, mild: Secondary | ICD-10-CM | POA: Diagnosis not present

## 2019-12-05 DIAGNOSIS — I119 Hypertensive heart disease without heart failure: Secondary | ICD-10-CM | POA: Diagnosis not present

## 2019-12-05 DIAGNOSIS — I10 Essential (primary) hypertension: Secondary | ICD-10-CM | POA: Diagnosis not present

## 2019-12-05 DIAGNOSIS — I483 Typical atrial flutter: Secondary | ICD-10-CM

## 2019-12-05 DIAGNOSIS — I2510 Atherosclerotic heart disease of native coronary artery without angina pectoris: Secondary | ICD-10-CM

## 2019-12-05 DIAGNOSIS — E782 Mixed hyperlipidemia: Secondary | ICD-10-CM | POA: Diagnosis not present

## 2019-12-05 DIAGNOSIS — E039 Hypothyroidism, unspecified: Secondary | ICD-10-CM

## 2019-12-05 MED ORDER — METOPROLOL SUCCINATE ER 25 MG PO TB24: 25.0000 mg | ORAL_TABLET | Freq: Every day | ORAL | 3 refills | Status: DC

## 2019-12-06 LAB — COMP. METABOLIC PANEL (12)
AST: 64 IU/L — ABNORMAL HIGH (ref 0–40)
Albumin/Globulin Ratio: 1.3 (ref 1.2–2.2)
Albumin: 3.9 g/dL (ref 3.8–4.8)
Alkaline Phosphatase: 98 IU/L (ref 39–117)
BUN/Creatinine Ratio: 19 (ref 12–28)
BUN: 15 mg/dL (ref 8–27)
Bilirubin Total: 0.3 mg/dL (ref 0.0–1.2)
Calcium: 9.8 mg/dL (ref 8.7–10.3)
Chloride: 97 mmol/L (ref 96–106)
Creatinine, Ser: 0.79 mg/dL (ref 0.57–1.00)
GFR calc Af Amer: 88 mL/min/{1.73_m2} (ref >59)
GFR calc non Af Amer: 76 mL/min/{1.73_m2} (ref >59)
Globulin, Total: 3 g/dL (ref 1.5–4.5)
Glucose: 99 mg/dL (ref 65–99)
Potassium: 4.8 mmol/L (ref 3.5–5.2)
Sodium: 138 mmol/L (ref 134–144)
Total Protein: 6.9 g/dL (ref 6.0–8.5)

## 2019-12-06 LAB — CBC WITH DIFFERENTIAL/PLATELET
Basophils Absolute: 0.1 10*3/uL (ref 0.0–0.2)
Basos: 1 %
EOS (ABSOLUTE): 0.1 10*3/uL (ref 0.0–0.4)
Eos: 2 %
Hematocrit: 41.9 % (ref 34.0–46.6)
Hemoglobin: 14.2 g/dL (ref 11.1–15.9)
Immature Grans (Abs): 0 10*3/uL (ref 0.0–0.1)
Immature Granulocytes: 0 %
Lymphocytes Absolute: 2.2 10*3/uL (ref 0.7–3.1)
Lymphs: 32 %
MCH: 31.6 pg (ref 26.6–33.0)
MCHC: 33.9 g/dL (ref 31.5–35.7)
MCV: 93 fL (ref 79–97)
Monocytes Absolute: 0.8 10*3/uL (ref 0.1–0.9)
Monocytes: 12 %
Neutrophils Absolute: 3.7 10*3/uL (ref 1.4–7.0)
Neutrophils: 53 %
Platelets: 156 10*3/uL (ref 150–450)
RBC: 4.5 x10E6/uL (ref 3.77–5.28)
RDW: 12.1 % (ref 11.7–15.4)
WBC: 6.9 10*3/uL (ref 3.4–10.8)

## 2019-12-06 LAB — LIPID PANEL
Chol/HDL Ratio: 5 ratio — ABNORMAL HIGH (ref 0.0–4.4)
Cholesterol, Total: 298 mg/dL — ABNORMAL HIGH (ref 100–199)
HDL: 60 mg/dL (ref >39)
LDL Chol Calc (NIH): 217 mg/dL — ABNORMAL HIGH (ref 0–99)
Triglycerides: 118 mg/dL (ref 0–149)
VLDL Cholesterol Cal: 21 mg/dL (ref 5–40)

## 2019-12-06 LAB — CARDIOVASCULAR RISK ASSESSMENT

## 2019-12-06 LAB — TSH: TSH: 1.72 u[IU]/mL (ref 0.450–4.500)

## 2019-12-09 ENCOUNTER — Encounter: Payer: Self-pay | Admitting: Family Medicine

## 2019-12-09 DIAGNOSIS — E782 Mixed hyperlipidemia: Secondary | ICD-10-CM | POA: Insufficient documentation

## 2019-12-09 DIAGNOSIS — I4892 Unspecified atrial flutter: Secondary | ICD-10-CM | POA: Insufficient documentation

## 2019-12-09 DIAGNOSIS — I483 Typical atrial flutter: Secondary | ICD-10-CM | POA: Insufficient documentation

## 2019-12-09 NOTE — Assessment & Plan Note (Signed)
Referred to Dr. Otho Perl. Overdue for follow up as she has not been seen since 2019.

## 2019-12-09 NOTE — Progress Notes (Signed)
Established Patient Office Visit  Subjective:  Patient ID: Alexandra Barajas, female    DOB: 07/07/1951  Age: 69 y.o. MRN: [id number]  CC:  Chief Complaint  Patient presents with  . Hyperlipidemia  . Hypothyroidism  . Coronary Artery Disease  . Aortic Insuffiency    HPI Alexandra Barajas presents for Hyperlipidemia The current episode started more than 1 year ago. The problem is uncontrolled. Recent lipid tests were reviewed and are high. Factors aggravating her hyperlipidemia include fatty foods. Associated symptoms include chest pain. Pertinent negatives include no myalgias or shortness of breath. Compliance problems include medication side effects.   Coronary Artery Disease Presents for follow-up visit. Symptoms include chest pain. Pertinent negatives include no dizziness or shortness of breath. Risk factors include hyperlipidemia. The symptoms have been worsening. Compliance with diet is variable. Compliance with exercise is variable. Compliance with medications is good.   Patient describes having chest pain approximately 2 weeks ago. She has difficulty describing it. Patient also suffers with hypothyroidism, hypertension, and moderate mental disabilities. She stay in an adult home.  She is taking levothyroxine, losartan, and hydrochlorothiazide. Past Medical History:  Diagnosis Date  . Aortic valve disease    S/P aortic valve replacement  . 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 Atrophy of thyroid   . Coronary artery disease 2003   cabg  . Depression   . Hyperlipidemia   . Hypertension   . Hypertensive heart disease without heart failure   . Moderate intellectual disabilities   . Osteoporosis   . Other nonrheumatic aortic valve disorders   . Other specified disorders of bone density and structure, multiple sites   . Presence of aortocoronary bypass graft   . Presence of prosthetic heart valve     Past Surgical History:  Procedure  Laterality Date  . ABDOMINAL HYSTERECTOMY     BL oophorectomy  . ANKLE SURGERY Left   . CARDIAC VALVE REPLACEMENT     2003 (aortic valve and mitral valve replacements.)  . CORONARY ARTERY BYPASS GRAFT      Family History  Family history unknown: Yes    Social History   Socioeconomic History  . Marital status: Single    Spouse name: Not on file  . Number of children: Not on file  . Years of education: Not on file  . Highest education level: Not on file  Occupational History  . Not on file  Tobacco Use  . Smoking status: Never Smoker  . Smokeless tobacco: Never Used  Substance and Sexual Activity  . Alcohol use: Never  . Drug use: Never  . Sexual activity: Not Currently  Other Topics Concern  . Not on file  Social History Narrative  . Not on file   Social Determinants of Health   Financial Resource Strain:   . Difficulty of Paying Living Expenses: Not on file  Food Insecurity:   . Worried About Charity fundraiser in the Last Year: Not on file  . Ran Out of Food in the Last Year: Not on file  Transportation Needs:   . Lack of Transportation (Medical): Not on file  . Lack of Transportation (Non-Medical): Not on file  Physical Activity:   . Days of Exercise per Week: Not on file  . Minutes of Exercise per Session: Not on file  Stress:   . Feeling of Stress : Not on file  Social Connections:   . Frequency of Communication with Friends and Family: Not  on file  . Frequency of Social Gatherings with Friends and Family: Not on file  . Attends Religious Services: Not on file  . Active Member of Clubs or Organizations: Not on file  . Attends Archivist Meetings: Not on file  . Marital Status: Not on file  Intimate Partner Violence:   . Fear of Current or Ex-Partner: Not on file  . Emotionally Abused: Not on file  . Physically Abused: Not on file  . Sexually Abused: Not on file    Outpatient Medications Prior to Visit  Medication Sig Dispense Refill  .  FLUoxetine (PROZAC) 20 MG tablet Take 20 mg by mouth daily.    Marland Kitchen alendronate (FOSAMAX) 70 MG tablet Take 70 mg by mouth once a week.    Marland Kitchen aspirin 81 MG EC tablet Take 81 mg by mouth daily.    . hydrochlorothiazide (HYDRODIURIL) 12.5 MG tablet Take 12.5 mg by mouth daily.    Marland Kitchen lactulose (CHRONULAC) 10 GM/15ML solution Take 10 g by mouth. Take 30 ml (20 gram) by oral route once daily    . levothyroxine (SYNTHROID) 50 MCG tablet Take 50 mcg by mouth daily.    Marland Kitchen losartan (COZAAR) 50 MG tablet Take 50 mg by mouth daily.     No facility-administered medications prior to visit.    Allergies  Allergen Reactions  . Crestor [Rosuvastatin Calcium] Other (See Comments)    ELEVATED LIVER ENZYMES  . Lipitor [Atorvastatin Calcium] Other (See Comments)    ELEVATED LIVER ENZYMES  . Lescol [Fluvastatin] Other (See Comments)    MYALGIA  . Pravachol [Pravastatin Sodium] Other (See Comments)    MYALGIA  . Zocor [Simvastatin] Other (See Comments)    MYALGIA  . Other Other (See Comments)    Reductase Inhibitors - unknown    ROS Review of Systems  Constitutional: Negative for chills, fatigue and fever.  HENT: Negative for congestion, ear pain and sore throat.   Respiratory: Negative for cough and shortness of breath.   Cardiovascular: Positive for chest pain.  Gastrointestinal: Negative for abdominal pain, constipation, diarrhea, nausea and vomiting.  Endocrine: Negative for polydipsia, polyphagia and polyuria.  Genitourinary: Negative for dysuria and urgency.  Musculoskeletal: Negative for arthralgias and myalgias.  Neurological: Negative for dizziness and headaches.  Psychiatric/Behavioral: Negative for dysphoric mood. The patient is not nervous/anxious.       Objective:    BP 130/78   Pulse 88   Temp 97.7 F (36.5 C)   Resp 18   Wt 178 lb 6.4 oz (80.9 kg)  Wt Readings from Last 3 Encounters:  12/05/19 178 lb 6.4 oz (80.9 kg)    Physical Exam  Constitutional: She appears  well-developed and well-nourished.  Cardiovascular: Normal rate, regular rhythm and normal heart sounds.  Pulmonary/Chest: Effort normal and breath sounds normal.  Abdominal: Soft. Bowel sounds are normal. There is no abdominal tenderness.  Psychiatric: She has a normal mood and affect. Her behavior is normal.  Patient mental disability is evident on exam, but answers questions appropriately    There are no preventive care reminders to display for this patient.   EKG - atrial flutter with heart rate in upper 90s.  Lab Results  Component Value Date   TSH 1.720 12/05/2019   Lab Results  Component Value Date   WBC 6.9 12/05/2019   HGB 14.2 12/05/2019   HCT 41.9 12/05/2019   MCV 93 12/05/2019   PLT 156 12/05/2019   Lab Results  Component Value Date  NA 138 12/05/2019   K 4.8 12/05/2019   GLUCOSE 99 12/05/2019   BUN 15 12/05/2019   CREATININE 0.79 12/05/2019   BILITOT 0.3 12/05/2019   ALKPHOS 98 12/05/2019   AST 64 (H) 12/05/2019   PROT 6.9 12/05/2019   ALBUMIN 3.9 12/05/2019   CALCIUM 9.8 12/05/2019   Lab Results  Component Value Date   CHOL 298 (H) 12/05/2019   Lab Results  Component Value Date   HDL 60 12/05/2019   Lab Results  Component Value Date   LDLCALC 217 (H) 12/05/2019   Lab Results  Component Value Date   TRIG 118 12/05/2019   Lab Results  Component Value Date   CHOLHDL 5.0 (H) 12/05/2019   No results found for: HGBA1C    Assessment & Plan:   Problem List Items Addressed This Visit      Cardiovascular and Mediastinum   CAD in native artery    Referred to Dr. Otho Perl. Overdue for follow up as she has not been seen since 2019. Question whether pt may be having angina.        Relevant Medications   aspirin 81 MG EC tablet   hydrochlorothiazide (HYDRODIURIL) 12.5 MG tablet   losartan (COZAAR) 50 MG tablet   metoprolol succinate (TOPROL-XL) 25 MG 24 hr tablet   Atrial flutter (HCC)    Referred to Dr. Otho Perl. Overdue for follow up as she has  not been seen since 2019.       Relevant Medications   metoprolol succinate (TOPROL-XL) 25 MG 24 hr tablet   Other Relevant Orders   EKG 12-Lead     Endocrine   Acquired hypothyroidism   Relevant Medications   levothyroxine (SYNTHROID) 50 MCG tablet     Other   Mixed hyperlipidemia   Relevant Medications   aspirin 81 MG EC tablet   Patient needs to be restarted on repatha. I will defer this to cardiology.    Other Relevant Orders   Lipid panel (Completed)   Chest pain - EKG shows no ischemic changes, but does show atrial flutter rate controlled.   Relevant Medications   metoprolol succinate (TOPROL-XL) 25 MG 24 hr tablet   Other Relevant Orders   Cardiovascular Risk Assessment (Completed)   Depression - keep follow up with Dr. Janeal Holmes   Relevant Medications   FLUoxetine (PROZAC) 20 MG tablet    Other Visit Diagnoses    Hyperlipidemia type III    -  Primary   Relevant Orders   Lipid panel (Completed)   Essential hypertension, benign       Relevant Medications   aspirin 81 MG EC tablet   hydrochlorothiazide (HYDRODIURIL) 12.5 MG tablet   losartan (COZAAR) 50 MG tablet   metoprolol succinate (TOPROL-XL) 25 MG 24 hr tablet   Other Relevant Orders   Comp. Metabolic Panel (12) (Completed)   CBC with Differential (Completed)   TSH (Completed)   Atherosclerosis of native coronary artery of native heart with angina pectoris (HCC)       Relevant Medications   aspirin 81 MG EC tablet   metoprolol succinate (TOPROL-XL) 25 MG 24 hr tablet   Other Relevant Orders   Lipid panel (Completed)   TSH (thyroid-stimulating hormone deficiency)       Relevant Medications   levothyroxine (SYNTHROID) 50 MCG tablet      Meds ordered this encounter  Medications  . metoprolol succinate (TOPROL-XL) 25 MG 24 hr tablet    Sig: Take 1 tablet (25 mg total) by mouth  daily.    Dispense:  90 tablet    Refill:  3    Follow-up: Return in about 3 months (around 03/03/2020) for fasting visit.     Rochel Brome, MD

## 2019-12-09 NOTE — Assessment & Plan Note (Signed)
Referred to Dr. Otho Perl. Overdue for follow up as she has not been seen since 2019. Question whether pt may be having angina.

## 2019-12-18 ENCOUNTER — Telehealth: Payer: Self-pay

## 2019-12-18 NOTE — Telephone Encounter (Signed)
Received letter from patient insurance in regards to Alendronate. Per Dr. Tobie Poet recommend patient to d/c medication. Patient is due for holiday from drug.  Left voicemail for patient to call back.

## 2019-12-21 DIAGNOSIS — Z23 Encounter for immunization: Secondary | ICD-10-CM | POA: Diagnosis not present

## 2020-01-02 DIAGNOSIS — Z1231 Encounter for screening mammogram for malignant neoplasm of breast: Secondary | ICD-10-CM | POA: Diagnosis not present

## 2020-01-02 DIAGNOSIS — M8589 Other specified disorders of bone density and structure, multiple sites: Secondary | ICD-10-CM | POA: Diagnosis not present

## 2020-01-02 DIAGNOSIS — N959 Unspecified menopausal and perimenopausal disorder: Secondary | ICD-10-CM | POA: Diagnosis not present

## 2020-01-07 ENCOUNTER — Other Ambulatory Visit: Payer: Self-pay | Admitting: Family Medicine

## 2020-01-07 NOTE — Progress Notes (Signed)
Discontinue alendronate because patient has been on for over 5 years and is due for a medication holiday. Hold until next bone density due. Kc

## 2020-01-08 ENCOUNTER — Telehealth: Payer: Self-pay

## 2020-01-08 DIAGNOSIS — R079 Chest pain, unspecified: Secondary | ICD-10-CM | POA: Diagnosis not present

## 2020-01-08 NOTE — Telephone Encounter (Signed)
I Called Group Home and I talked with Gwen about to discontinue alendronate because patient has been on for over 5 years and is due for a medication holiday. Hold until next bone density due. Sent letter head to adult home. Faxed to [phone].

## 2020-01-08 NOTE — Telephone Encounter (Signed)
Gwen from the group home called about the patient stating that the patient's repatha is 500$ and is not covered by her insurance even with approval from her insurance. Patient has previously been on praluent and it gave her a rash. She has been on statins before but now Meredith Mody is wanting to know if we can put her back on a pill because she can't afford the repatha anymore? LA

## 2020-01-10 NOTE — Telephone Encounter (Signed)
Recommend try zetia 10 mg once daily. Kc

## 2020-01-14 ENCOUNTER — Other Ambulatory Visit: Payer: Self-pay

## 2020-01-14 MED ORDER — EZETIMIBE 10 MG PO TABS: 10.0000 mg | ORAL_TABLET | Freq: Every day | ORAL | 0 refills | Status: DC

## 2020-01-14 NOTE — Telephone Encounter (Signed)
Gwen from the group home informed, writing D/C order to be signed and faxed to the group home and sending in the Zetia 58m for the patient to start. LA

## 2020-01-16 ENCOUNTER — Encounter: Payer: Self-pay | Admitting: Family Medicine

## 2020-01-18 DIAGNOSIS — Z23 Encounter for immunization: Secondary | ICD-10-CM | POA: Diagnosis not present

## 2020-01-30 DIAGNOSIS — Z951 Presence of aortocoronary bypass graft: Secondary | ICD-10-CM | POA: Diagnosis not present

## 2020-01-30 DIAGNOSIS — Z952 Presence of prosthetic heart valve: Secondary | ICD-10-CM | POA: Diagnosis not present

## 2020-01-30 DIAGNOSIS — R9439 Abnormal result of other cardiovascular function study: Secondary | ICD-10-CM | POA: Insufficient documentation

## 2020-01-30 DIAGNOSIS — E785 Hyperlipidemia, unspecified: Secondary | ICD-10-CM | POA: Diagnosis not present

## 2020-01-30 DIAGNOSIS — I2510 Atherosclerotic heart disease of native coronary artery without angina pectoris: Secondary | ICD-10-CM | POA: Diagnosis not present

## 2020-01-30 DIAGNOSIS — Z9889 Other specified postprocedural states: Secondary | ICD-10-CM | POA: Diagnosis not present

## 2020-02-05 DIAGNOSIS — B974 Respiratory syncytial virus as the cause of diseases classified elsewhere: Secondary | ICD-10-CM | POA: Diagnosis not present

## 2020-02-05 DIAGNOSIS — J101 Influenza due to other identified influenza virus with other respiratory manifestations: Secondary | ICD-10-CM | POA: Diagnosis not present

## 2020-02-05 DIAGNOSIS — Z20828 Contact with and (suspected) exposure to other viral communicable diseases: Secondary | ICD-10-CM | POA: Diagnosis not present

## 2020-02-05 DIAGNOSIS — U071 COVID-19: Secondary | ICD-10-CM | POA: Diagnosis not present

## 2020-02-06 ENCOUNTER — Other Ambulatory Visit: Payer: Self-pay

## 2020-02-06 ENCOUNTER — Ambulatory Visit (INDEPENDENT_AMBULATORY_CARE_PROVIDER_SITE_OTHER): Payer: Medicare Other | Admitting: Legal Medicine

## 2020-02-06 ENCOUNTER — Encounter: Payer: Self-pay | Admitting: Legal Medicine

## 2020-02-06 VITALS — BP 118/60 | HR 70 | Temp 97.7°F | Resp 17 | Ht 63.39 in | Wt 177.0 lb

## 2020-02-06 DIAGNOSIS — J101 Influenza due to other identified influenza virus with other respiratory manifestations: Secondary | ICD-10-CM | POA: Diagnosis not present

## 2020-02-06 DIAGNOSIS — B974 Respiratory syncytial virus as the cause of diseases classified elsewhere: Secondary | ICD-10-CM | POA: Diagnosis not present

## 2020-02-06 DIAGNOSIS — R1013 Epigastric pain: Secondary | ICD-10-CM

## 2020-02-06 DIAGNOSIS — R1011 Right upper quadrant pain: Secondary | ICD-10-CM | POA: Diagnosis not present

## 2020-02-06 DIAGNOSIS — Z20828 Contact with and (suspected) exposure to other viral communicable diseases: Secondary | ICD-10-CM | POA: Diagnosis not present

## 2020-02-06 DIAGNOSIS — U071 COVID-19: Secondary | ICD-10-CM | POA: Diagnosis not present

## 2020-02-06 MED ORDER — OMEPRAZOLE 40 MG PO CPDR: 40.0000 mg | DELAYED_RELEASE_CAPSULE | Freq: Every day | ORAL | 3 refills | Status: DC

## 2020-02-06 NOTE — Assessment & Plan Note (Signed)
Patient has tenderness in epigastric area, she still has gall bladder.  I stopped her asa for now.  Start omeprazole 1m qd to cover for gastritis/ ulcer, culture stool for E. Coli- she had this in past with similar symptoms.  We will order gall bladder ultrasound.

## 2020-02-06 NOTE — Patient Instructions (Addendum)
Stop aspirin, use omeprazole once a day. Ultrasound of gall bladder

## 2020-02-06 NOTE — Progress Notes (Signed)
Acute Office Visit  Subjective:    Patient ID: Alexandra Barajas, female    DOB: Sep 04, 1951, 69 y.o.   MRN: [id number]  Chief Complaint  Patient presents with   Abdominal Pain    Nausea, Black stools since 3 days ago    HPI Patient is in today for stomach pain for 3 days.  He is having hematemesis to day.  ?Black stools. She was having epigastric pain going to back today.  She feels fine now. H/o E. Coli colitis and her stool are loose now.  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    Atrophy of thyroid    Coronary artery disease 2003   cabg   Depression    Hyperlipidemia    Hypertension    Hypertensive heart disease without heart failure    Moderate intellectual disabilities    Osteoporosis    Other nonrheumatic aortic valve disorders    Other specified disorders of bone density and structure, multiple sites    Presence of aortocoronary bypass graft    Presence of prosthetic heart valve     Past Surgical History:  Procedure Laterality Date   ABDOMINAL HYSTERECTOMY     BL oophorectomy   ANKLE SURGERY Left    CARDIAC VALVE REPLACEMENT     2003 (aortic valve and mitral valve replacements.)   CORONARY ARTERY BYPASS GRAFT      Family History  Family history unknown: Yes    Social History   Socioeconomic History   Marital status: Single    Spouse name: Not on file   Number of children: Not on file   Years of education: Not on file   Highest education level: Not on file  Occupational History   Not on file  Tobacco Use   Smoking status: Never Smoker   Smokeless tobacco: Never Used  Substance and Sexual Activity   Alcohol use: Never   Drug use: Never   Sexual activity: Not Currently  Other Topics Concern   Not on file  Social History Narrative   Not on file   Social Determinants of Health    Financial Resource Strain:    Difficulty of Paying Living Expenses:   Food Insecurity:    Worried About Charity fundraiser in the Last Year:    Arboriculturist in the Last Year:   Transportation Needs:    Film/video editor (Medical):    Lack of Transportation (Non-Medical):   Physical Activity:    Days of Exercise per Week:    Minutes of Exercise per Session:   Stress:    Feeling of Stress :   Social Connections:    Frequency of Communication with Friends and Family:    Frequency of Social Gatherings with Friends and Family:    Attends Religious Services:    Active Member of Clubs or Organizations:    Attends Music therapist:    Marital Status:   Intimate Partner Violence:    Fear of Current or Ex-Partner:    Emotionally Abused:    Physically Abused:    Sexually Abused:     Outpatient Medications Prior to Visit  Medication Sig Dispense Refill   alendronate (FOSAMAX) 70 MG tablet Take 70 mg by mouth once a week. Take with a full glass of water on an empty stomach.     aspirin 81  MG EC tablet Take 81 mg by mouth daily.     chlorhexidine (PERIDEX) 0.12 % solution      Evolocumab (REPATHA SURECLICK) 366 MG/ML SOAJ Inject into the skin.     ezetimibe (ZETIA) 10 MG tablet Take 1 tablet (10 mg total) by mouth daily. 90 tablet 0   FLUoxetine (PROZAC) 20 MG tablet Take 20 mg by mouth daily.     hydrochlorothiazide (HYDRODIURIL) 12.5 MG tablet Take 12.5 mg by mouth daily.     lactulose (CHRONULAC) 10 GM/15ML solution Take 10 g by mouth. Take 30 ml (20 gram) by oral route once daily     levothyroxine (SYNTHROID) 50 MCG tablet Take 50 mcg by mouth daily.     losartan (COZAAR) 50 MG tablet Take 50 mg by mouth daily.     metoprolol succinate (TOPROL-XL) 25 MG 24 hr tablet Take 1 tablet (25 mg total) by mouth daily. 90 tablet 3   Prenatal Vit-Fe Fumarate-FA (MULTIVITAMIN-PRENATAL) 27-0.8 MG TABS tablet Take 1 tablet by mouth daily at 12  noon.     ranolazine (RANEXA) 500 MG 12 hr tablet Take by mouth.     TYLENOL 500 MG tablet TAKE 1 TABLET BY MOUTH EVERY 4 HOURS AS NEEDED FOR PAIN FOR 72 HOURS     No facility-administered medications prior to visit.    Allergies  Allergen Reactions   Crestor [Rosuvastatin Calcium] Other (See Comments)    ELEVATED LIVER ENZYMES   Lipitor [Atorvastatin Calcium] Other (See Comments)    ELEVATED LIVER ENZYMES   Lescol [Fluvastatin] Other (See Comments)    MYALGIA   Pravachol [Pravastatin Sodium] Other (See Comments)    MYALGIA   Zocor [Simvastatin] Other (See Comments)    MYALGIA   Other Other (See Comments)    Reductase Inhibitors - unknown    Review of Systems  Constitutional: Negative.   HENT: Negative.   Eyes: Negative.   Respiratory: Negative.   Cardiovascular: Negative.   Gastrointestinal: Positive for abdominal pain.  Genitourinary: Negative.   Musculoskeletal: Negative.   Skin: Negative.   Neurological: Negative.        Objective:    Physical Exam Vitals reviewed.  Constitutional:      Appearance: She is well-developed.  HENT:     Head: Normocephalic.  Cardiovascular:     Rate and Rhythm: Normal rate and regular rhythm.     Heart sounds: Normal heart sounds.  Pulmonary:     Breath sounds: Normal breath sounds.  Abdominal:     General: Abdomen is flat and scaphoid. Bowel sounds are normal.     Palpations: Abdomen is soft.     Tenderness: There is abdominal tenderness in the right upper quadrant and epigastric area. Negative signs include Murphy's sign.     Comments: Rectal performed, no stool  Neurological:     Mental Status: She is alert.     BP 118/60 (BP Location: Right Arm, Patient Position: Sitting)    Pulse 70    Temp 97.7 F (36.5 C) (Temporal)    Resp 17    Ht 5' 3.39" (1.61 m)    Wt 177 lb (80.3 kg)    BMI 30.97 kg/m  Wt Readings from Last 3 Encounters:  02/06/20 177 lb (80.3 kg)  12/05/19 178 lb 6.4 oz (80.9 kg)    Health  Maintenance Due  Topic Date Due   Hepatitis C Screening  Never done   COLONOSCOPY  Never done    There are no preventive care reminders to  display for this patient.   Lab Results  Component Value Date   TSH 1.720 12/05/2019   Lab Results  Component Value Date   WBC 6.9 12/05/2019   HGB 14.2 12/05/2019   HCT 41.9 12/05/2019   MCV 93 12/05/2019   PLT 156 12/05/2019   Lab Results  Component Value Date   NA 138 12/05/2019   K 4.8 12/05/2019   GLUCOSE 99 12/05/2019   BUN 15 12/05/2019   CREATININE 0.79 12/05/2019   BILITOT 0.3 12/05/2019   ALKPHOS 98 12/05/2019   AST 64 (H) 12/05/2019   PROT 6.9 12/05/2019   ALBUMIN 3.9 12/05/2019   CALCIUM 9.8 12/05/2019   Lab Results  Component Value Date   CHOL 298 (H) 12/05/2019   Lab Results  Component Value Date   HDL 60 12/05/2019   Lab Results  Component Value Date   LDLCALC 217 (H) 12/05/2019   Lab Results  Component Value Date   TRIG 118 12/05/2019   Lab Results  Component Value Date   CHOLHDL 5.0 (H) 12/05/2019   No results found for: HGBA1C     Assessment & Plan:   Problem List Items Addressed This Visit      Other   Abdominal pain, RUQ    Patient has tenderness in epigastric area, she still has gall bladder.  I stopped her asa for now.  Start omeprazole 8m qd to cover for gastritis/ ulcer, culture stool for E. Coli- she had this in past with similar symptoms.  We will order gall bladder ultrasound.      Relevant Orders   [place]Abdomen Limited RUQ    Other Visit Diagnoses    Epigastric abdominal pain    -  Primary   Relevant Medications   omeprazole (PRILOSEC) 40 MG capsule   Other Relevant Orders   CBC with Differential   Comprehensive metabolic panel   Stool culture       Meds ordered this encounter  Medications   omeprazole (PRILOSEC) 40 MG capsule    Sig: Take 1 capsule (40 mg total) by mouth daily.    Dispense:  30 capsule    Refill:  3     LReinaldo Meeker MD

## 2020-02-07 DIAGNOSIS — R1013 Epigastric pain: Secondary | ICD-10-CM | POA: Diagnosis not present

## 2020-02-07 LAB — CBC WITH DIFFERENTIAL/PLATELET
Basophils Absolute: 0.1 10*3/uL (ref 0.0–0.2)
Basos: 1 %
EOS (ABSOLUTE): 0.3 10*3/uL (ref 0.0–0.4)
Eos: 3 %
Hematocrit: 43.1 % (ref 34.0–46.6)
Hemoglobin: 14.3 g/dL (ref 11.1–15.9)
Immature Grans (Abs): 0 10*3/uL (ref 0.0–0.1)
Immature Granulocytes: 0 %
Lymphocytes Absolute: 2.4 10*3/uL (ref 0.7–3.1)
Lymphs: 29 %
MCH: 30.3 pg (ref 26.6–33.0)
MCHC: 33.2 g/dL (ref 31.5–35.7)
MCV: 91 fL (ref 79–97)
Monocytes Absolute: 0.9 10*3/uL (ref 0.1–0.9)
Monocytes: 11 %
Neutrophils Absolute: 4.6 10*3/uL (ref 1.4–7.0)
Neutrophils: 56 %
Platelets: 175 10*3/uL (ref 150–450)
RBC: 4.72 x10E6/uL (ref 3.77–5.28)
RDW: 12.2 % (ref 11.7–15.4)
WBC: 8.3 10*3/uL (ref 3.4–10.8)

## 2020-02-07 LAB — COMPREHENSIVE METABOLIC PANEL
ALT: 70 IU/L — ABNORMAL HIGH (ref 0–32)
AST: 81 IU/L — ABNORMAL HIGH (ref 0–40)
Albumin/Globulin Ratio: 1.4 (ref 1.2–2.2)
Albumin: 4.1 g/dL (ref 3.8–4.8)
Alkaline Phosphatase: 98 IU/L (ref 39–117)
BUN/Creatinine Ratio: 21 (ref 12–28)
BUN: 13 mg/dL (ref 8–27)
Bilirubin Total: 0.3 mg/dL (ref 0.0–1.2)
CO2: 26 mmol/L (ref 20–29)
Calcium: 10.1 mg/dL (ref 8.7–10.3)
Chloride: 95 mmol/L — ABNORMAL LOW (ref 96–106)
Creatinine, Ser: 0.61 mg/dL (ref 0.57–1.00)
GFR calc Af Amer: 106 mL/min/{1.73_m2} (ref >59)
GFR calc non Af Amer: 92 mL/min/{1.73_m2} (ref >59)
Globulin, Total: 3 g/dL (ref 1.5–4.5)
Glucose: 88 mg/dL (ref 65–99)
Potassium: 4.5 mmol/L (ref 3.5–5.2)
Sodium: 134 mmol/L (ref 134–144)
Total Protein: 7.1 g/dL (ref 6.0–8.5)

## 2020-02-07 NOTE — Progress Notes (Signed)
CBC normal, no anemia, Kidney tests normal, liver tests mildly elevated- get ultrasound of abdomen lp

## 2020-02-11 LAB — STOOL CULTURE: E coli, Shiga toxin Assay: NEGATIVE

## 2020-02-11 NOTE — Progress Notes (Signed)
Stool cultures negative no E. Coli or other pathogens found lp

## 2020-02-18 DIAGNOSIS — R1011 Right upper quadrant pain: Secondary | ICD-10-CM | POA: Diagnosis not present

## 2020-02-20 ENCOUNTER — Encounter: Payer: Self-pay | Admitting: Legal Medicine

## 2020-02-20 ENCOUNTER — Other Ambulatory Visit: Payer: Self-pay

## 2020-02-20 ENCOUNTER — Ambulatory Visit (INDEPENDENT_AMBULATORY_CARE_PROVIDER_SITE_OTHER): Payer: Medicare Other | Admitting: Legal Medicine

## 2020-02-20 VITALS — BP 90/70 | HR 76 | Temp 98.0°F | Resp 17 | Ht 62.21 in | Wt 181.0 lb

## 2020-02-20 DIAGNOSIS — R1013 Epigastric pain: Secondary | ICD-10-CM

## 2020-02-20 DIAGNOSIS — R1011 Right upper quadrant pain: Secondary | ICD-10-CM

## 2020-02-20 NOTE — Assessment & Plan Note (Signed)
Patient has no further pain in abdomen since on omeprazole.  Plan to contin ue omeprazole for 6 months , then stop

## 2020-02-20 NOTE — Progress Notes (Signed)
Established Patient Office Visit  Subjective:  Patient ID: Alexandra Barajas, female    DOB: 1951/03/27  Age: 69 y.o. MRN: [id number]  CC:  Chief Complaint  Patient presents with  . Abdominal Pain    Follow up of epigastric abdominal pain    HPI Alexandra Barajas presents for abdominal pain, all the lab work was normal.  The omeprazole has resolved all the pain. No nausea or vomiting.  Past Medical History:  Diagnosis Date  . Aortic valve disease    S/P aortic valve replacement  . 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 Atrophy of thyroid   . Coronary artery disease 2003   cabg  . Depression   . Hyperlipidemia   . Hypertension   . Hypertensive heart disease without heart failure   . Moderate intellectual disabilities   . Osteoporosis   . Other nonrheumatic aortic valve disorders   . Other specified disorders of bone density and structure, multiple sites   . Presence of aortocoronary bypass graft   . Presence of prosthetic heart valve     Past Surgical History:  Procedure Laterality Date  . ABDOMINAL HYSTERECTOMY     BL oophorectomy  . ANKLE SURGERY Left   . CARDIAC VALVE REPLACEMENT     2003 (aortic valve and mitral valve replacements.)  . CORONARY ARTERY BYPASS GRAFT      Family History  Family history unknown: Yes    Social History   Socioeconomic History  . Marital status: Single    Spouse name: Not on file  . Number of children: Not on file  . Years of education: Not on file  . Highest education level: Not on file  Occupational History  . Not on file  Tobacco Use  . Smoking status: Never Smoker  . Smokeless tobacco: Never Used  Substance and Sexual Activity  . Alcohol use: Never  . Drug use: Never  . Sexual activity: Not Currently  Other Topics Concern  . Not on file  Social History Narrative  . Not on file   Social Determinants of Health   Financial Resource Strain:   . Difficulty of Paying Living  Expenses:   Food Insecurity:   . Worried About Charity fundraiser in the Last Year:   . Arboriculturist in the Last Year:   Transportation Needs:   . Film/video editor (Medical):   Marland Kitchen Lack of Transportation (Non-Medical):   Physical Activity:   . Days of Exercise per Week:   . Minutes of Exercise per Session:   Stress:   . Feeling of Stress :   Social Connections:   . Frequency of Communication with Friends and Family:   . Frequency of Social Gatherings with Friends and Family:   . Attends Religious Services:   . Active Member of Clubs or Organizations:   . Attends Archivist Meetings:   Marland Kitchen Marital Status:   Intimate Partner Violence:   . Fear of Current or Ex-Partner:   . Emotionally Abused:   Marland Kitchen Physically Abused:   . Sexually Abused:     Outpatient Medications Prior to Visit  Medication Sig Dispense Refill  . alendronate (FOSAMAX) 70 MG tablet Take 70 mg by mouth once a week. Take with a full glass of water on an empty stomach.    Marland Kitchen aspirin 81 MG EC tablet Take 81 mg by mouth daily.    . chlorhexidine (PERIDEX) 0.12 % solution     .  Evolocumab (REPATHA SURECLICK) 099 MG/ML SOAJ Inject into the skin.    Marland Kitchen ezetimibe (ZETIA) 10 MG tablet Take 1 tablet (10 mg total) by mouth daily. 90 tablet 0  . FLUoxetine (PROZAC) 20 MG capsule Take 20 capsules by mouth daily.    . hydrochlorothiazide (HYDRODIURIL) 12.5 MG tablet Take 12.5 mg by mouth daily.    Marland Kitchen lactulose (CHRONULAC) 10 GM/15ML solution Take 10 g by mouth. Take 30 ml (20 gram) by oral route once daily    . levothyroxine (SYNTHROID) 50 MCG tablet Take 50 mcg by mouth daily.    Marland Kitchen losartan (COZAAR) 50 MG tablet Take 50 mg by mouth daily.    . metoprolol succinate (TOPROL-XL) 25 MG 24 hr tablet Take 1 tablet (25 mg total) by mouth daily. 90 tablet 3  . omeprazole (PRILOSEC) 40 MG capsule Take 1 capsule (40 mg total) by mouth daily. 30 capsule 3  . Prenatal Vit-Fe Fumarate-FA (MULTIVITAMIN-PRENATAL) 27-0.8 MG TABS  tablet Take 1 tablet by mouth daily at 12 noon.    . ranolazine (RANEXA) 500 MG 12 hr tablet Take by mouth.    . TYLENOL 500 MG tablet TAKE 1 TABLET BY MOUTH EVERY 4 HOURS AS NEEDED FOR PAIN FOR 72 HOURS    . FLUoxetine (PROZAC) 20 MG tablet Take 20 mg by mouth daily.     No facility-administered medications prior to visit.    Allergies  Allergen Reactions  . Crestor [Rosuvastatin Calcium] Other (See Comments)    ELEVATED LIVER ENZYMES  . Lipitor [Atorvastatin Calcium] Other (See Comments)    ELEVATED LIVER ENZYMES  . Lescol [Fluvastatin] Other (See Comments)    MYALGIA  . Pravachol [Pravastatin Sodium] Other (See Comments)    MYALGIA  . Zocor [Simvastatin] Other (See Comments)    MYALGIA  . Other Other (See Comments)    Reductase Inhibitors - unknown    ROS Review of Systems  Constitutional: Negative.   Eyes: Negative.   Respiratory: Negative.   Cardiovascular: Negative.   Endocrine: Negative.   Genitourinary: Negative.   Neurological: Negative.       Objective:    Physical Exam  Constitutional: She appears well-developed and well-nourished.  Cardiovascular: Normal rate, regular rhythm, normal heart sounds and intact distal pulses.  Pulmonary/Chest: Effort normal and breath sounds normal.  Abdominal: Soft. Bowel sounds are normal.  Vitals reviewed.   BP 90/70 (BP Location: Right Arm, Patient Position: Sitting)   Pulse 76   Temp 98 F (36.7 C) (Temporal)   Resp 17   Ht 5' 2.21" (1.58 m)   Wt 181 lb (82.1 kg)   BMI 32.89 kg/m  Wt Readings from Last 3 Encounters:  02/20/20 181 lb (82.1 kg)  02/06/20 177 lb (80.3 kg)  12/05/19 178 lb 6.4 oz (80.9 kg)     Health Maintenance Due  Topic Date Due  . Hepatitis C Screening  Never done  . COVID-19 Vaccine (1) Never done  . COLONOSCOPY  Never done    There are no preventive care reminders to display for this patient.  Lab Results  Component Value Date   TSH 1.720 12/05/2019   Lab Results  Component  Value Date   WBC 8.3 02/06/2020   HGB 14.3 02/06/2020   HCT 43.1 02/06/2020   MCV 91 02/06/2020   PLT 175 02/06/2020   Lab Results  Component Value Date   NA 134 02/06/2020   K 4.5 02/06/2020   CO2 26 02/06/2020   GLUCOSE 88 02/06/2020   BUN  13 02/06/2020   CREATININE 0.61 02/06/2020   BILITOT 0.3 02/06/2020   ALKPHOS 98 02/06/2020   AST 81 (H) 02/06/2020   ALT 70 (H) 02/06/2020   PROT 7.1 02/06/2020   ALBUMIN 4.1 02/06/2020   CALCIUM 10.1 02/06/2020   Lab Results  Component Value Date   CHOL 298 (H) 12/05/2019   Lab Results  Component Value Date   HDL 60 12/05/2019   Lab Results  Component Value Date   LDLCALC 217 (H) 12/05/2019   Lab Results  Component Value Date   TRIG 118 12/05/2019   Lab Results  Component Value Date   CHOLHDL 5.0 (H) 12/05/2019   No results found for: HGBA1C    Assessment & Plan:   Problem List Items Addressed This Visit      Other   Abdominal pain, RUQ    Patient has no further pain in abdomen since on omeprazole.  Plan to contin ue omeprazole for 6 months , then stop       Other Visit Diagnoses    Epigastric abdominal pain    -  Primary      No orders of the defined types were placed in this encounter.   Follow-up: Return if symptoms worsen or fail to improve.    Reinaldo Meeker, MD

## 2020-02-21 ENCOUNTER — Encounter: Payer: Self-pay | Admitting: Family Medicine

## 2020-02-21 NOTE — Progress Notes (Signed)
Established Patient Office Visit  Subjective:  Patient ID: Alexandra Barajas, female    DOB: 22-Mar-1951  Age: 69 y.o. MRN: [id number]  CC:  Chief Complaint  Patient presents with  . Annual Exam    DEXA 06/25/2016 Osteopenia, PAP 11/18/2014 Normal; f/u not indicated,     HPI Physical ("At Risk" items are starred): Patient's last physical exam was 1 year ago.   Weight:  Obeset (BMI 31%);  Blood Pressure: ** Known hypertension, but controlled with medication;  Medical History: Patient history reviewed; Family history reviewed;  Allergies Reviewed: No change in current allergies;  Medications Reviewed: Medications reviewed - no changes; Her last Pap smear was in 11/18/2014 and follow-up not indicated.   Her last mammogram was in 08/24/2018 and was normal.   Her last bone density was in 06/25/2016 and was abnormal and was c/w osteopenia.   Patient has never had a colonoscopy. Patient's last eye exam was in 2019.  Patient is current with their Td, Pneumococcal 23, influenza and TdaP.  She/he is not current with Shingrix immunization.   Cognitive Impairment: General Appearance: Normal; Mood/Affect: Normal; Cognitive Impairment Testing:  patient has moderate intelllectual disability;  Depression Screening: Negative for depression; The patient's functional abilities include steady gait, yes How long did it take the patient to get up and walk from a sitting position? ( < 5 seconds ), The patient is able to perform the following ADL's unassisted bathe and dress, but not prepare meals,  Patient handles her own medication? ( no ), Does the patient handle their own money? ( no ), Is the patient's home safe? (Good lighting, handrails, etc)( yes ), Any hearing difficulties? ( no ), Any vision difficulties? ( yes - wears glasses ), Current level of pain on a scale of 0-10 ( 0 ), Patient is not at risk for falls.   Advanced Care Planning: Was Advanced Care Planning discussed? ( no );  Lipids: ** Known mixed  hyperlipidemia - controlled on medication;  Smoking: Life-long non-smoker;  Diabetes Screening: Negative diabetic screen within the past year;  Nutrition:  Weight is above the healthy range for height;  Physical Activity:  Exercises infrequently;  Alcohol/Drug Use: Is a non-drinker; No illicit drug use  Patient needs her [place] 2 form [place].  Past Medical History:  Diagnosis Date  . Aortic valve disease    S/P aortic valve replacement  . 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 Atrophy of thyroid   . Coronary artery disease 2003   cabg  . Depression   . Hyperlipidemia   . Hypertension   . Hypertensive heart disease without heart failure   . Moderate intellectual disabilities   . Osteoporosis   . Other nonrheumatic aortic valve disorders   . Other specified disorders of bone density and structure, multiple sites   . Presence of aortocoronary bypass graft   . Presence of prosthetic heart valve     Past Surgical History:  Procedure Laterality Date  . ABDOMINAL HYSTERECTOMY     BL oophorectomy  . ANKLE SURGERY Left   . CARDIAC VALVE REPLACEMENT     2003 (aortic valve and mitral valve replacements.)  . CORONARY ARTERY BYPASS GRAFT      Family History  Family history unknown: Yes    Social History   Socioeconomic History  . Marital status: Single    Spouse name: Not on file  . Number of children: Not on file  . Years of  education: Not on file  . Highest education level: Not on file  Occupational History  . Not on file  Tobacco Use  . Smoking status: Never Smoker  . Smokeless tobacco: Never Used  Substance and Sexual Activity  . Alcohol use: Never  . Drug use: Never  . Sexual activity: Not Currently  Other Topics Concern  . Not on file  Social History Narrative   Lives at [place] Strain:   . Difficulty of Paying Living Expenses:   Food Insecurity:   .  Worried About Charity fundraiser in the Last Year:   . Arboriculturist in the Last Year:   Transportation Needs:   . Film/video editor (Medical):   Marland Kitchen Lack of Transportation (Non-Medical):   Physical Activity:   . Days of Exercise per Week:   . Minutes of Exercise per Session:   Stress:   . Feeling of Stress :   Social Connections:   . Frequency of Communication with Friends and Family:   . Frequency of Social Gatherings with Friends and Family:   . Attends Religious Services:   . Active Member of Clubs or Organizations:   . Attends Archivist Meetings:   Marland Kitchen Marital Status:   Intimate Partner Violence:   . Fear of Current or Ex-Partner:   . Emotionally Abused:   Marland Kitchen Physically Abused:   . Sexually Abused:     Outpatient Medications Prior to Visit  Medication Sig Dispense Refill  . alendronate (FOSAMAX) 70 MG tablet Take 70 mg by mouth once a week. Take with a full glass of water on an empty stomach.    Marland Kitchen aspirin 81 MG EC tablet Take 81 mg by mouth daily.    . chlorhexidine (PERIDEX) 0.12 % solution     . Evolocumab (REPATHA SURECLICK) 235 MG/ML SOAJ Inject into the skin.    Marland Kitchen ezetimibe (ZETIA) 10 MG tablet Take 1 tablet (10 mg total) by mouth daily. 90 tablet 0  . FLUoxetine (PROZAC) 20 MG capsule Take 20 capsules by mouth daily.    . hydrochlorothiazide (HYDRODIURIL) 12.5 MG tablet Take 12.5 mg by mouth daily.    Marland Kitchen lactulose (CHRONULAC) 10 GM/15ML solution Take 10 g by mouth. Take 30 ml (20 gram) by oral route once daily    . levothyroxine (SYNTHROID) 50 MCG tablet Take 50 mcg by mouth daily.    Marland Kitchen losartan (COZAAR) 50 MG tablet Take 50 mg by mouth daily.    . metoprolol succinate (TOPROL-XL) 25 MG 24 hr tablet Take 1 tablet (25 mg total) by mouth daily. 90 tablet 3  . omeprazole (PRILOSEC) 40 MG capsule Take 1 capsule (40 mg total) by mouth daily. 30 capsule 3  . Prenatal Vit-Fe Fumarate-FA (MULTIVITAMIN-PRENATAL) 27-0.8 MG TABS tablet Take 1 tablet by mouth daily  at 12 noon.    . ranolazine (RANEXA) 500 MG 12 hr tablet Take by mouth.    . TYLENOL 500 MG tablet TAKE 1 TABLET BY MOUTH EVERY 4 HOURS AS NEEDED FOR PAIN FOR 72 HOURS     No facility-administered medications prior to visit.    Allergies  Allergen Reactions  . Crestor [Rosuvastatin Calcium] Other (See Comments)    ELEVATED LIVER ENZYMES  . Lipitor [Atorvastatin Calcium] Other (See Comments)    ELEVATED LIVER ENZYMES  . Lescol [Fluvastatin] Other (See Comments)    MYALGIA  . Pravachol [Pravastatin Sodium] Other (See Comments)  MYALGIA  . Zocor [Simvastatin] Other (See Comments)    MYALGIA  . Other Other (See Comments)    Reductase Inhibitors - unknown    ROS Review of Systems  Constitutional: Negative for chills, fatigue and fever.  HENT: Negative for congestion, ear pain, rhinorrhea and sore throat.   Respiratory: Negative for cough and shortness of breath.   Cardiovascular: Negative for chest pain.  Gastrointestinal: Negative for abdominal pain, constipation, diarrhea, nausea and vomiting.  Genitourinary: Negative for dysuria and urgency.  Musculoskeletal: Negative for back pain and myalgias.  Neurological: Negative for dizziness, weakness, light-headedness and headaches.  Psychiatric/Behavioral: Negative for dysphoric mood. The patient is not nervous/anxious.       Objective:    Physical Exam  Constitutional: She appears well-developed and well-nourished.  HENT:  Right Ear: Tympanic membrane, external ear and ear canal normal.  Left Ear: Tympanic membrane, external ear and ear canal normal.  Nose: Nose normal.  Mouth/Throat: Oropharynx is clear and moist. No oropharyngeal exudate.  Cardiovascular: Normal rate, regular rhythm and normal heart sounds.  Pulmonary/Chest: Effort normal and breath sounds normal. No respiratory distress.  Abdominal: Soft. Bowel sounds are normal. There is no abdominal tenderness.  Musculoskeletal:     Cervical back: Normal range of  motion.  Neurological: She is alert.  Patient has he has mental delays.  She is focused on having her sweet tea back which was taken away from her for limited at her previous doctor's visit.  Skin: Skin is warm.  Psychiatric: She has a normal mood and affect. Her behavior is normal.    BP 116/70   Pulse 84   Temp (!) 97.2 F (36.2 C)   Ht 5' 3.25" (1.607 m)   Wt 178 lb (80.7 kg)   SpO2 98%   BMI 31.28 kg/m  Wt Readings from Last 3 Encounters:  02/25/20 178 lb (80.7 kg)  02/20/20 181 lb (82.1 kg)  02/06/20 177 lb (80.3 kg)     Health Maintenance Due  Topic Date Due  . Hepatitis C Screening  Never done  . COVID-19 Vaccine (1) Never done  . COLONOSCOPY  Never done    There are no preventive care reminders to display for this patient.  Lab Results  Component Value Date   TSH 1.720 12/05/2019   Lab Results  Component Value Date   WBC 8.3 02/06/2020   HGB 14.3 02/06/2020   HCT 43.1 02/06/2020   MCV 91 02/06/2020   PLT 175 02/06/2020   Lab Results  Component Value Date   NA 134 02/06/2020   K 4.5 02/06/2020   CO2 26 02/06/2020   GLUCOSE 88 02/06/2020   BUN 13 02/06/2020   CREATININE 0.61 02/06/2020   BILITOT 0.3 02/06/2020   ALKPHOS 98 02/06/2020   AST 81 (H) 02/06/2020   ALT 70 (H) 02/06/2020   PROT 7.1 02/06/2020   ALBUMIN 4.1 02/06/2020   CALCIUM 10.1 02/06/2020   Lab Results  Component Value Date   CHOL 298 (H) 12/05/2019   Lab Results  Component Value Date   HDL 60 12/05/2019   Lab Results  Component Value Date   LDLCALC 217 (H) 12/05/2019   Lab Results  Component Value Date   TRIG 118 12/05/2019   Lab Results  Component Value Date   CHOLHDL 5.0 (H) 12/05/2019   No results found for: HGBA1C    Assessment & Plan:  1. Routine medical exam Work on eating healthy. Increase exercise.  Work on Artist.  2. Mixed hyperlipidemia Well controlled.  Intolerant to statins.  Currently on Zetia and Repatha No changes to  medicines.  Continue to work on eating a healthy diet and exercise.  Labs drawn today.  - Comprehensive metabolic panel; Future - Lipid panel; Future  3. Hypertension with heart disease Well controlled.  No changes to medicines.  Continue to work on eating a healthy diet and exercise.  Labs drawn today.  - CBC with Differential/Platelet; Future  4. Screening examination for pulmonary tuberculosis - PPD  5. Major depressive disorder, recurrent episode, mild ([place]) Well-controlled.  Management per Dr.Pekarek  6. Class 1 obesity due to excess calories with serious comorbidity and body mass index (BMI) of 31.0 to 31.9 in adult Work on eating healthy and exercising.  7. Coronary artery disease involving native coronary artery of native heart without angina pectoris Continue current medications which includes Ranexa, metoprolol, losartan, Zetia, and Repatha.  8. Typical atrial flutter (HCC) Continue metoprolol.  Today appears to be in sinus rhythm.  9. Myalgia due to HMG CoA reductase inhibitor Patient is intolerant to numerous statins  10. Secondary hypothyroidism Well-controlled.  Continue current medication  11. Mental disability Comanaged with Dr. Missy Sabins.  Patient is currently residing in an adult care home and appears to be well taken care of.  We will fill out [place] 2 form.  12. Osteopenia Order bone density. Currently on alendronate and calcium with vitamin D.  54.  Breast cancer screening-order mammogram.  14.  Colon cancer screening deferred due to coronary artery disease and heart issues.  Above Labwork needs to be scheduled after 03/04/2020.   Follow-up: Return in about 15 weeks (around 06/09/2020) for fasting. Rochel Brome, MD

## 2020-02-25 ENCOUNTER — Ambulatory Visit (INDEPENDENT_AMBULATORY_CARE_PROVIDER_SITE_OTHER): Payer: Medicare Other | Admitting: Family Medicine

## 2020-02-25 ENCOUNTER — Encounter: Payer: Self-pay | Admitting: Family Medicine

## 2020-02-25 ENCOUNTER — Other Ambulatory Visit: Payer: Self-pay

## 2020-02-25 VITALS — BP 116/70 | HR 84 | Temp 97.2°F | Ht 63.25 in | Wt 178.0 lb

## 2020-02-25 DIAGNOSIS — Z111 Encounter for screening for respiratory tuberculosis: Secondary | ICD-10-CM

## 2020-02-25 DIAGNOSIS — Z0000 Encounter for general adult medical examination without abnormal findings: Secondary | ICD-10-CM

## 2020-02-25 DIAGNOSIS — E6609 Other obesity due to excess calories: Secondary | ICD-10-CM

## 2020-02-25 DIAGNOSIS — F79 Unspecified intellectual disabilities: Secondary | ICD-10-CM

## 2020-02-25 DIAGNOSIS — I2510 Atherosclerotic heart disease of native coronary artery without angina pectoris: Secondary | ICD-10-CM | POA: Diagnosis not present

## 2020-02-25 DIAGNOSIS — M810 Age-related osteoporosis without current pathological fracture: Secondary | ICD-10-CM

## 2020-02-25 DIAGNOSIS — E782 Mixed hyperlipidemia: Secondary | ICD-10-CM

## 2020-02-25 DIAGNOSIS — Z1231 Encounter for screening mammogram for malignant neoplasm of breast: Secondary | ICD-10-CM | POA: Diagnosis not present

## 2020-02-25 DIAGNOSIS — I483 Typical atrial flutter: Secondary | ICD-10-CM

## 2020-02-25 DIAGNOSIS — Z6831 Body mass index (BMI) 31.0-31.9, adult: Secondary | ICD-10-CM

## 2020-02-25 DIAGNOSIS — T466X5A Adverse effect of antihyperlipidemic and antiarteriosclerotic drugs, initial encounter: Secondary | ICD-10-CM

## 2020-02-25 DIAGNOSIS — E038 Other specified hypothyroidism: Secondary | ICD-10-CM | POA: Diagnosis not present

## 2020-02-25 DIAGNOSIS — I119 Hypertensive heart disease without heart failure: Secondary | ICD-10-CM | POA: Diagnosis not present

## 2020-02-25 DIAGNOSIS — F330 Major depressive disorder, recurrent, mild: Secondary | ICD-10-CM | POA: Diagnosis not present

## 2020-02-25 DIAGNOSIS — M791 Myalgia, unspecified site: Secondary | ICD-10-CM

## 2020-02-27 ENCOUNTER — Other Ambulatory Visit: Payer: Self-pay | Admitting: Family Medicine

## 2020-02-27 DIAGNOSIS — Z6831 Body mass index (BMI) 31.0-31.9, adult: Secondary | ICD-10-CM | POA: Insufficient documentation

## 2020-02-27 DIAGNOSIS — M791 Myalgia, unspecified site: Secondary | ICD-10-CM | POA: Insufficient documentation

## 2020-02-27 DIAGNOSIS — F79 Unspecified intellectual disabilities: Secondary | ICD-10-CM | POA: Insufficient documentation

## 2020-02-27 DIAGNOSIS — Z0000 Encounter for general adult medical examination without abnormal findings: Secondary | ICD-10-CM | POA: Insufficient documentation

## 2020-02-27 DIAGNOSIS — E6609 Other obesity due to excess calories: Secondary | ICD-10-CM | POA: Insufficient documentation

## 2020-02-27 DIAGNOSIS — M810 Age-related osteoporosis without current pathological fracture: Secondary | ICD-10-CM | POA: Insufficient documentation

## 2020-02-27 DIAGNOSIS — Z111 Encounter for screening for respiratory tuberculosis: Secondary | ICD-10-CM | POA: Insufficient documentation

## 2020-02-27 DIAGNOSIS — E038 Other specified hypothyroidism: Secondary | ICD-10-CM | POA: Insufficient documentation

## 2020-02-27 DIAGNOSIS — F330 Major depressive disorder, recurrent, mild: Secondary | ICD-10-CM | POA: Insufficient documentation

## 2020-02-27 DIAGNOSIS — Z1231 Encounter for screening mammogram for malignant neoplasm of breast: Secondary | ICD-10-CM | POA: Insufficient documentation

## 2020-02-27 DIAGNOSIS — T466X5A Adverse effect of antihyperlipidemic and antiarteriosclerotic drugs, initial encounter: Secondary | ICD-10-CM | POA: Insufficient documentation

## 2020-02-27 NOTE — Patient Instructions (Addendum)
Recommend work on eating healthy and exercise.  Recommended increase water intake.  Patient is to limit her sweet tea somewhat. Recommend establish advanced directives.  Information given below. Patient is due for bone density test.  We will schedule this. Patient is due for mammogram we will schedule this as well.   Advance Directive  Advance directives are legal documents that let you make choices ahead of time about your health care and medical treatment in case you become unable to communicate for yourself. Advance directives are a way for you to make known your wishes to family, friends, and health care providers. This can let others know about your end-of-life care if you become unable to communicate. Discussing and writing advance directives should happen over time rather than all at once. Advance directives can be changed depending on your situation and what you want, even after you have signed the advance directives. There are different types of advance directives, such as:  Medical power of attorney.  Living will.  Do not resuscitate (DNR) or do not attempt resuscitation (DNAR) order. Health care proxy and medical power of attorney A health care proxy is also called a health care agent. This is a person who is appointed to make medical decisions for you in cases where you are unable to make the decisions yourself. Generally, people choose someone they know well and trust to represent their preferences. Make sure to ask this person for an agreement to act as your proxy. A proxy may have to exercise judgment in the event of a medical decision for which your wishes are not known. A medical power of attorney is a legal document that names your health care proxy. Depending on the laws in your state, after the document is written, it may also need to be:  Signed.  Notarized.  Dated.  Copied.  Witnessed.  Incorporated into your medical record. You may also want to appoint someone  to manage your money in a situation in which you are unable to do so. This is called a durable power of attorney for finances. It is a separate legal document from the durable power of attorney for health care. You may choose the same person or someone different from your health care proxy to act as your agent in money matters. If you do not appoint a proxy, or if there is a concern that the proxy is not acting in your best interests, a court may appoint a guardian to act on your behalf. Living will A living will is a set of instructions that state your wishes about medical care when you cannot express them yourself. Health care providers should keep a copy of your living will in your medical record. You may want to give a copy to family members or friends. To alert caregivers in case of an emergency, you can place a card in your wallet to let them know that you have a living will and where they can find it. A living will is used if you become:  Terminally ill.  Disabled.  Unable to communicate or make decisions. Items to consider in your living will include:  To use or not to use life-support equipment, such as dialysis machines and breathing machines (ventilators).  A DNR or DNAR order. This tells health care providers not to use cardiopulmonary resuscitation (CPR) if breathing or heartbeat stops.  To use or not to use tube feeding.  To be given or not to be given food and fluids.  Comfort (  palliative) care when the goal becomes comfort rather than a cure.  Donation of organs and tissues. A living will does not give instructions for distributing your money and property if you should pass away. DNR or DNAR A DNR or DNAR order is a request not to have CPR in the event that your heart stops beating or you stop breathing. If a DNR or DNAR order has not been made and shared, a health care provider will try to help any patient whose heart has stopped or who has stopped breathing. If you plan to  have surgery, talk with your health care provider about how your DNR or DNAR order will be followed if problems occur. What if I do not have an advance directive? If you do not have an advance directive, some states assign family decision makers to act on your behalf based on how closely you are related to them. Each state has its own laws about advance directives. You may want to check with your health care provider, attorney, or state representative about the laws in your state. Summary  Advance directives are the legal documents that allow you to make choices ahead of time about your health care and medical treatment in case you become unable to tell others about your care.  The process of discussing and writing advance directives should happen over time. You can change the advance directives, even after you have signed them.  Advance directives include DNR or DNAR orders, living wills, and designating an agent as your medical power of attorney. This information is not intended to replace advice given to you by your health care provider. Make sure you discuss any questions you have with your health care provider. Document Revised: 05/17/2019 Document Reviewed: 05/17/2019 Elsevier Patient Education  [place] 65 Years and Older, Female Preventive care refers to lifestyle choices and visits with your health care provider that can promote health and wellness. This includes:  A yearly physical exam. This is also called an annual well check.  Regular dental and eye exams.  Immunizations.  Screening for certain conditions.  Healthy lifestyle choices, such as diet and exercise. What can I expect for my preventive care visit? Physical exam Your health care provider will check:  Height and weight. These may be used to calculate body mass index (BMI), which is a measurement that tells if you are at a healthy weight.  Heart rate and blood pressure.  Your skin for  abnormal spots. Counseling Your health care provider may ask you questions about:  Alcohol, tobacco, and drug use.  Emotional well-being.  Home and relationship well-being.  Sexual activity.  Eating habits.  History of falls.  Memory and ability to understand (cognition).  Work and work Statistician.  Pregnancy and menstrual history. What immunizations do I need?  Influenza (flu) vaccine  This is recommended every year. Tetanus, diphtheria, and pertussis (Tdap) vaccine  You may need a Td booster every 10 years. Varicella (chickenpox) vaccine  You may need this vaccine if you have not already been vaccinated. Zoster (shingles) vaccine  You may need this after age 78. Pneumococcal conjugate (PCV13) vaccine  One dose is recommended after age 6. Pneumococcal polysaccharide (PPSV23) vaccine  One dose is recommended after age 69. Measles, mumps, and rubella (MMR) vaccine  You may need at least one dose of MMR if you were born in 1957 or later. You may also need a second dose. Meningococcal conjugate (MenACWY) vaccine  You may need this  if you have certain conditions. Hepatitis A vaccine  You may need this if you have certain conditions or if you travel or work in places where you may be exposed to hepatitis A. Hepatitis B vaccine  You may need this if you have certain conditions or if you travel or work in places where you may be exposed to hepatitis B. Haemophilus influenzae type b (Hib) vaccine  You may need this if you have certain conditions. You may receive vaccines as individual doses or as more than one vaccine together in one shot (combination vaccines). Talk with your health care provider about the risks and benefits of combination vaccines. What tests do I need? Blood tests  Lipid and cholesterol levels. These may be checked every 5 years, or more frequently depending on your overall health.  Hepatitis C test.  Hepatitis B test. Screening  Lung  cancer screening. You may have this screening every year starting at age 43 if you have a 30-pack-year history of smoking and currently smoke or have quit within the past 15 years.  Colorectal cancer screening. All adults should have this screening starting at age 75 and continuing until age 22. Your health care provider may recommend screening at age 56 if you are at increased risk. You will have tests every 1-10 years, depending on your results and the type of screening test.  Diabetes screening. This is done by checking your blood sugar (glucose) after you have not eaten for a while (fasting). You may have this done every 1-3 years.  Mammogram. This may be done every 1-2 years. Talk with your health care provider about how often you should have regular mammograms.  BRCA-related cancer screening. This may be done if you have a family history of breast, ovarian, tubal, or peritoneal cancers. Other tests  Sexually transmitted disease (STD) testing.  Bone density scan. This is done to screen for osteoporosis. You may have this done starting at age 62. Follow these instructions at home: Eating and drinking  Eat a diet that includes fresh fruits and vegetables, whole grains, lean protein, and low-fat dairy products. Limit your intake of foods with high amounts of sugar, saturated fats, and salt.  Take vitamin and mineral supplements as recommended by your health care provider.  Do not drink alcohol if your health care provider tells you not to drink.  If you drink alcohol: ? Limit how much you have to 0-1 drink a day. ? Be aware of how much alcohol is in your drink. In the [place] one drink equals one 12 oz bottle of beer (355 mL), one 5 oz glass of wine (148 mL), or one 1 oz glass of hard liquor (44 mL). Lifestyle  Take daily care of your teeth and gums.  Stay active. Exercise for at least 30 minutes on 5 or more days each week.  Do not use any products that contain nicotine or tobacco,  such as cigarettes, e-cigarettes, and chewing tobacco. If you need help quitting, ask your health care provider.  If you are sexually active, practice safe sex. Use a condom or other form of protection in order to prevent STIs (sexually transmitted infections).  Talk with your health care provider about taking a low-dose aspirin or statin. What's next?  Go to your health care provider once a year for a well check visit.  Ask your health care provider how often you should have your eyes and teeth checked.  Stay up to date on all vaccines. This information  is not intended to replace advice given to you by your health care provider. Make sure you discuss any questions you have with your health care provider. Document Revised: 10/12/2018 Document Reviewed: 10/12/2018 Elsevier Patient Education  2020 [employer].

## 2020-03-04 NOTE — Progress Notes (Signed)
No showed. kc

## 2020-03-06 ENCOUNTER — Ambulatory Visit (INDEPENDENT_AMBULATORY_CARE_PROVIDER_SITE_OTHER): Payer: Medicare Other | Admitting: Family Medicine

## 2020-03-06 DIAGNOSIS — E782 Mixed hyperlipidemia: Secondary | ICD-10-CM

## 2020-03-06 DIAGNOSIS — I119 Hypertensive heart disease without heart failure: Secondary | ICD-10-CM

## 2020-03-06 DIAGNOSIS — E039 Hypothyroidism, unspecified: Secondary | ICD-10-CM

## 2020-03-14 ENCOUNTER — Other Ambulatory Visit: Payer: Self-pay | Admitting: Family Medicine

## 2020-03-18 NOTE — Progress Notes (Signed)
Subjective:  Patient ID: Alexandra Barajas, female    DOB: July 06, 1951  Age: 69 y.o. MRN: [id number]  Chief Complaint  Patient presents with  . Hyperlipidemia  . Hypertension  . Hypothyroidism    HPI  69 yo female with past medical history of hypertensive heart disease, hyperlipidemia, hypothyroidism, mentally disabled, and osteoporosis. Patient is eating healthy and exercising.  She is intolerant to statins.  For her hypertensive heart disease and elevated cholesterol she is on zetia, ranexa, aspirin, losartan, metoprolol,  hctz, zetia and repatha. She is intolerant to statins.  Her most recent nuclear stress test was abnormal, but she has had no further chest pain or shortness of breath. Cardiology recommended monitor and treat medically.  Patient has osteoporosis and is currently on alendronate.  She on omeprazole for gerd.   Past Medical History:  Diagnosis Date  . Aortic valve disease    S/P aortic valve replacement  . 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 Atrophy of thyroid   . Coronary artery disease 2003   cabg  . Depression   . Hyperlipidemia   . Hypertension   . Hypertensive heart disease without heart failure   . Moderate intellectual disabilities   . Osteoporosis   . Other nonrheumatic aortic valve disorders   . Other specified disorders of bone density and structure, multiple sites   . Presence of aortocoronary bypass graft   . Presence of prosthetic heart valve    Past Surgical History:  Procedure Laterality Date  . ABDOMINAL HYSTERECTOMY     BL oophorectomy  . ANKLE SURGERY Left   . CARDIAC VALVE REPLACEMENT     2003 (aortic valve and mitral valve replacements.)  . CORONARY ARTERY BYPASS GRAFT      Family History  Family history unknown: Yes   Social History   Socioeconomic History  . Marital status: Single    Spouse name: Not on file  . Number of children: Not on file  . Years of education: Not on file    . Highest education level: Not on file  Occupational History  . Not on file  Tobacco Use  . Smoking status: Never Smoker  . Smokeless tobacco: Never Used  Substance and Sexual Activity  . Alcohol use: Never  . Drug use: Never  . Sexual activity: Not Currently  Other Topics Concern  . Not on file  Social History Narrative   Lives at [place] Strain:   . Difficulty of Paying Living Expenses:   Food Insecurity:   . Worried About Charity fundraiser in the Last Year:   . Arboriculturist in the Last Year:   Transportation Needs:   . Film/video editor (Medical):   Marland Kitchen Lack of Transportation (Non-Medical):   Physical Activity:   . Days of Exercise per Week:   . Minutes of Exercise per Session:   Stress:   . Feeling of Stress :   Social Connections:   . Frequency of Communication with Friends and Family:   . Frequency of Social Gatherings with Friends and Family:   . Attends Religious Services:   . Active Member of Clubs or Organizations:   . Attends Archivist Meetings:   Marland Kitchen Marital Status:     Review of Systems  Constitutional: Negative for chills, fatigue and fever.  HENT: Negative for congestion, ear pain, rhinorrhea and sore throat.   Respiratory:  Negative for cough and shortness of breath.   Cardiovascular: Negative for chest pain.  Gastrointestinal: Negative for abdominal pain, constipation, diarrhea, nausea and vomiting.  Genitourinary: Negative for dysuria and urgency.  Musculoskeletal: Negative for back pain and myalgias.  Neurological: Negative for dizziness, weakness, light-headedness and headaches.  Psychiatric/Behavioral: Negative for dysphoric mood. The patient is not nervous/anxious.      Objective:  BP 122/68   Pulse 93   Temp 98.7 F (37.1 C)   Ht 5' 6" (1.676 m)   Wt 177 lb (80.3 kg)   SpO2 93%   BMI 28.57 kg/m   BP/Weight 03/21/2020 02/25/2020 3/71/6967  Systolic BP 893 810  90  Diastolic BP 68 70 70  Wt. (Lbs) 177 178 181  BMI 28.57 31.28 32.89    Physical Exam Vitals reviewed.  Constitutional:      Appearance: Normal appearance. She is normal weight.  Neck:     Vascular: No carotid bruit.  Cardiovascular:     Rate and Rhythm: Normal rate and regular rhythm.     Pulses: Normal pulses.     Heart sounds: Normal heart sounds.  Pulmonary:     Effort: Pulmonary effort is normal. No respiratory distress.     Breath sounds: Normal breath sounds.  Abdominal:     General: Abdomen is flat. Bowel sounds are normal.     Palpations: Abdomen is soft.     Tenderness: There is no abdominal tenderness.  Neurological:     Mental Status: She is alert and oriented to person, place, and time.  Psychiatric:        Mood and Affect: Mood normal.        Behavior: Behavior normal.     Diabetic Foot Exam - Simple   No data filed       Lab Results  Component Value Date   WBC 8.3 02/06/2020   HGB 14.3 02/06/2020   HCT 43.1 02/06/2020   PLT 175 02/06/2020   GLUCOSE 88 02/06/2020   CHOL 298 (H) 12/05/2019   TRIG 118 12/05/2019   HDL 60 12/05/2019   LDLCALC 217 (H) 12/05/2019   ALT 70 (H) 02/06/2020   AST 81 (H) 02/06/2020   NA 134 02/06/2020   K 4.5 02/06/2020   CL 95 (L) 02/06/2020   CREATININE 0.61 02/06/2020   BUN 13 02/06/2020   CO2 26 02/06/2020   TSH 1.720 12/05/2019      Assessment & Plan:  1. Acquired hypothyroidism The current medical regimen is effective;  continue present plan and medications.  2. Mixed hyperlipidemia Await labs for medication changes. Continue to work on eating a healthy diet and exercise.  Labs drawn today.  - Lipid panel  3. Hypertension with heart disease Well controlled.  No changes to medicines.  Continue to work on eating a healthy diet and exercise.  Labs drawn today.  - Comprehensive metabolic panel - CBC with Differential/Platelet  4. Prediabetes Recommend continue to work on eating healthy diet and  exercise. - Hemoglobin A1c  5. Age-related osteoporosis without current pathological fracture The current medical regimen is effective;  continue present plan and medications.  6. BMI 28.0-28.9,adult Recommend continue to work on eating healthy diet and exercise.   Orders Placed This Encounter  Procedures  . Lipid panel  . Comprehensive metabolic panel  . CBC with Differential/Platelet  . Hemoglobin A1c     Follow-up: No follow-ups on file.  An After Visit Summary was printed and given to the patient.  Elnita Maxwell  [place] [phone]

## 2020-03-21 ENCOUNTER — Other Ambulatory Visit: Payer: Self-pay

## 2020-03-21 ENCOUNTER — Encounter: Payer: Self-pay | Admitting: Family Medicine

## 2020-03-21 ENCOUNTER — Ambulatory Visit (INDEPENDENT_AMBULATORY_CARE_PROVIDER_SITE_OTHER): Payer: Medicare Other | Admitting: Family Medicine

## 2020-03-21 VITALS — BP 122/68 | HR 93 | Temp 98.7°F | Ht 66.0 in | Wt 177.0 lb

## 2020-03-21 DIAGNOSIS — Z6828 Body mass index (BMI) 28.0-28.9, adult: Secondary | ICD-10-CM | POA: Diagnosis not present

## 2020-03-21 DIAGNOSIS — M810 Age-related osteoporosis without current pathological fracture: Secondary | ICD-10-CM | POA: Diagnosis not present

## 2020-03-21 DIAGNOSIS — E782 Mixed hyperlipidemia: Secondary | ICD-10-CM

## 2020-03-21 DIAGNOSIS — I119 Hypertensive heart disease without heart failure: Secondary | ICD-10-CM | POA: Diagnosis not present

## 2020-03-21 DIAGNOSIS — E039 Hypothyroidism, unspecified: Secondary | ICD-10-CM | POA: Diagnosis not present

## 2020-03-21 DIAGNOSIS — R7303 Prediabetes: Secondary | ICD-10-CM | POA: Diagnosis not present

## 2020-03-22 LAB — CBC WITH DIFFERENTIAL/PLATELET
Basophils Absolute: 0 10*3/uL (ref 0.0–0.2)
Basos: 1 %
EOS (ABSOLUTE): 0.2 10*3/uL (ref 0.0–0.4)
Eos: 3 %
Hematocrit: 43.7 % (ref 34.0–46.6)
Hemoglobin: 14.3 g/dL (ref 11.1–15.9)
Immature Grans (Abs): 0 10*3/uL (ref 0.0–0.1)
Immature Granulocytes: 1 %
Lymphocytes Absolute: 1.9 10*3/uL (ref 0.7–3.1)
Lymphs: 29 %
MCH: 30.4 pg (ref 26.6–33.0)
MCHC: 32.7 g/dL (ref 31.5–35.7)
MCV: 93 fL (ref 79–97)
Monocytes Absolute: 0.7 10*3/uL (ref 0.1–0.9)
Monocytes: 10 %
Neutrophils Absolute: 3.8 10*3/uL (ref 1.4–7.0)
Neutrophils: 56 %
Platelets: 143 10*3/uL — ABNORMAL LOW (ref 150–450)
RBC: 4.7 x10E6/uL (ref 3.77–5.28)
RDW: 13.3 % (ref 11.7–15.4)
WBC: 6.6 10*3/uL (ref 3.4–10.8)

## 2020-03-22 LAB — COMPREHENSIVE METABOLIC PANEL
ALT: 98 IU/L — ABNORMAL HIGH (ref 0–32)
AST: 100 IU/L — ABNORMAL HIGH (ref 0–40)
Albumin/Globulin Ratio: 1.5 (ref 1.2–2.2)
Albumin: 4.1 g/dL (ref 3.8–4.8)
Alkaline Phosphatase: 94 IU/L (ref 48–121)
BUN/Creatinine Ratio: 14 (ref 12–28)
BUN: 12 mg/dL (ref 8–27)
Bilirubin Total: 0.4 mg/dL (ref 0.0–1.2)
CO2: 27 mmol/L (ref 20–29)
Calcium: 9.8 mg/dL (ref 8.7–10.3)
Chloride: 95 mmol/L — ABNORMAL LOW (ref 96–106)
Creatinine, Ser: 0.83 mg/dL (ref 0.57–1.00)
GFR calc Af Amer: 83 mL/min/{1.73_m2} (ref >59)
GFR calc non Af Amer: 72 mL/min/{1.73_m2} (ref >59)
Globulin, Total: 2.7 g/dL (ref 1.5–4.5)
Glucose: 103 mg/dL — ABNORMAL HIGH (ref 65–99)
Potassium: 4.6 mmol/L (ref 3.5–5.2)
Sodium: 135 mmol/L (ref 134–144)
Total Protein: 6.8 g/dL (ref 6.0–8.5)

## 2020-03-22 LAB — HEMOGLOBIN A1C
Est. average glucose Bld gHb Est-mCnc: 128 mg/dL
Hgb A1c MFr Bld: 6.1 % — ABNORMAL HIGH (ref 4.8–5.6)

## 2020-03-22 LAB — LIPID PANEL
Chol/HDL Ratio: 4.6 ratio — ABNORMAL HIGH (ref 0.0–4.4)
Cholesterol, Total: 255 mg/dL — ABNORMAL HIGH (ref 100–199)
HDL: 56 mg/dL (ref >39)
LDL Chol Calc (NIH): 180 mg/dL — ABNORMAL HIGH (ref 0–99)
Triglycerides: 110 mg/dL (ref 0–149)
VLDL Cholesterol Cal: 19 mg/dL (ref 5–40)

## 2020-03-22 LAB — CARDIOVASCULAR RISK ASSESSMENT

## 2020-03-23 ENCOUNTER — Encounter: Payer: Self-pay | Admitting: Family Medicine

## 2020-04-09 DIAGNOSIS — Z951 Presence of aortocoronary bypass graft: Secondary | ICD-10-CM | POA: Diagnosis not present

## 2020-04-09 DIAGNOSIS — Z952 Presence of prosthetic heart valve: Secondary | ICD-10-CM | POA: Diagnosis not present

## 2020-04-09 DIAGNOSIS — E785 Hyperlipidemia, unspecified: Secondary | ICD-10-CM | POA: Diagnosis not present

## 2020-04-09 DIAGNOSIS — Z9889 Other specified postprocedural states: Secondary | ICD-10-CM | POA: Diagnosis not present

## 2020-04-09 DIAGNOSIS — I2510 Atherosclerotic heart disease of native coronary artery without angina pectoris: Secondary | ICD-10-CM | POA: Diagnosis not present

## 2020-05-14 ENCOUNTER — Other Ambulatory Visit: Payer: Self-pay | Admitting: Family Medicine

## 2020-05-14 DIAGNOSIS — E039 Hypothyroidism, unspecified: Secondary | ICD-10-CM

## 2020-05-14 DIAGNOSIS — I483 Typical atrial flutter: Secondary | ICD-10-CM

## 2020-05-14 DIAGNOSIS — E038 Other specified hypothyroidism: Secondary | ICD-10-CM

## 2020-05-16 ENCOUNTER — Other Ambulatory Visit: Payer: Self-pay | Admitting: Family Medicine

## 2020-06-21 ENCOUNTER — Other Ambulatory Visit: Payer: Self-pay | Admitting: Legal Medicine

## 2020-06-21 ENCOUNTER — Other Ambulatory Visit: Payer: Self-pay | Admitting: Physician Assistant

## 2020-06-21 DIAGNOSIS — R1013 Epigastric pain: Secondary | ICD-10-CM

## 2020-06-21 MED ORDER — OMEPRAZOLE 40 MG PO CPDR: 40.0000 mg | DELAYED_RELEASE_CAPSULE | Freq: Every day | ORAL | 0 refills | Status: DC

## 2020-06-23 ENCOUNTER — Other Ambulatory Visit: Payer: Self-pay

## 2020-06-23 DIAGNOSIS — R1013 Epigastric pain: Secondary | ICD-10-CM

## 2020-06-23 MED ORDER — OMEPRAZOLE 40 MG PO CPDR: 40.0000 mg | DELAYED_RELEASE_CAPSULE | Freq: Every day | ORAL | 0 refills | Status: DC

## 2020-07-11 ENCOUNTER — Other Ambulatory Visit: Payer: Self-pay

## 2020-07-11 MED ORDER — CALCIUM CARBONATE-VITAMIN D 600-400 MG-UNIT PO TABS
1.0000 | ORAL_TABLET | Freq: Two times a day (BID) | ORAL | 1 refills | Status: DC
Start: 2020-07-11 — End: 2020-09-11

## 2020-07-11 MED ORDER — PRENATAL 27-0.8 MG PO TABS
1.0000 | ORAL_TABLET | Freq: Every day | ORAL | 1 refills | Status: DC
Start: 2020-07-11 — End: 2020-09-11

## 2020-07-11 MED ORDER — DSS 100 MG PO CAPS
1.0000 | ORAL_CAPSULE | Freq: Every day | ORAL | 1 refills | Status: DC
Start: 2020-07-11 — End: 2020-09-11

## 2020-07-24 DIAGNOSIS — Z20822 Contact with and (suspected) exposure to covid-19: Secondary | ICD-10-CM | POA: Diagnosis not present

## 2020-07-29 DIAGNOSIS — Z20822 Contact with and (suspected) exposure to covid-19: Secondary | ICD-10-CM | POA: Diagnosis not present

## 2020-08-20 ENCOUNTER — Other Ambulatory Visit: Payer: Self-pay

## 2020-08-20 ENCOUNTER — Ambulatory Visit: Payer: Medicare Other

## 2020-08-20 DIAGNOSIS — Z23 Encounter for immunization: Secondary | ICD-10-CM

## 2020-09-11 ENCOUNTER — Other Ambulatory Visit: Payer: Self-pay

## 2020-09-11 MED ORDER — CALCIUM CARBONATE-VITAMIN D 600-400 MG-UNIT PO TABS: 1.0000 | ORAL_TABLET | Freq: Two times a day (BID) | ORAL | 11 refills | Status: DC

## 2020-09-11 MED ORDER — BENEFIBER PO POWD: ORAL | 11 refills | Status: DC

## 2020-09-11 MED ORDER — DSS 100 MG PO CAPS: 1.0000 | ORAL_CAPSULE | Freq: Every day | ORAL | 11 refills | Status: DC

## 2020-09-11 MED ORDER — PRENATAL 27-0.8 MG PO TABS: 1.0000 | ORAL_TABLET | Freq: Every day | ORAL | 11 refills | Status: DC

## 2020-10-08 DIAGNOSIS — Z9889 Other specified postprocedural states: Secondary | ICD-10-CM | POA: Diagnosis not present

## 2020-10-08 DIAGNOSIS — Z952 Presence of prosthetic heart valve: Secondary | ICD-10-CM | POA: Diagnosis not present

## 2020-10-08 DIAGNOSIS — E785 Hyperlipidemia, unspecified: Secondary | ICD-10-CM | POA: Diagnosis not present

## 2020-10-08 DIAGNOSIS — Z789 Other specified health status: Secondary | ICD-10-CM | POA: Diagnosis not present

## 2020-10-08 DIAGNOSIS — I071 Rheumatic tricuspid insufficiency: Secondary | ICD-10-CM | POA: Diagnosis not present

## 2020-10-08 DIAGNOSIS — I2510 Atherosclerotic heart disease of native coronary artery without angina pectoris: Secondary | ICD-10-CM | POA: Diagnosis not present

## 2020-10-08 DIAGNOSIS — Z951 Presence of aortocoronary bypass graft: Secondary | ICD-10-CM | POA: Diagnosis not present

## 2020-11-11 ENCOUNTER — Ambulatory Visit (INDEPENDENT_AMBULATORY_CARE_PROVIDER_SITE_OTHER): Payer: Medicare Other

## 2020-11-11 DIAGNOSIS — Z23 Encounter for immunization: Secondary | ICD-10-CM | POA: Diagnosis not present

## 2020-11-11 NOTE — Progress Notes (Signed)
Covid-19 Vaccination [facility]  Name:  Alexandra Barajas    MRN: [id number] DOB: 1951/07/30  11/11/2020  [name] was observed post Covid-19 immunization for 15 minutes without incident. She was provided with Vaccine Information Sheet and instruction to access the V-Safe system.   [name] was instructed to call 911 with any severe reactions post vaccine: Marland Kitchen Difficulty breathing  . Swelling of face and throat  . A fast heartbeat  . A bad rash all over body  . Dizziness and weakness   Immunizations Administered    Name Date Dose VIS Date Route   Moderna Covid-19 Booster Vaccine 11/11/2020  3:45 PM 0.25 mL 08/20/2020 Intramuscular   Manufacturer: Moderna   Lot: [id number]   [place]: [id number]

## 2021-02-24 ENCOUNTER — Encounter: Payer: Self-pay | Admitting: Nurse Practitioner

## 2021-02-24 ENCOUNTER — Other Ambulatory Visit: Payer: Self-pay

## 2021-02-24 ENCOUNTER — Telehealth: Payer: Self-pay

## 2021-02-24 ENCOUNTER — Ambulatory Visit (INDEPENDENT_AMBULATORY_CARE_PROVIDER_SITE_OTHER): Payer: Medicare Other | Admitting: Nurse Practitioner

## 2021-02-24 VITALS — BP 126/74 | HR 87 | Temp 97.1°F | Resp 16 | Ht 63.78 in | Wt 174.0 lb

## 2021-02-24 DIAGNOSIS — Z1231 Encounter for screening mammogram for malignant neoplasm of breast: Secondary | ICD-10-CM | POA: Diagnosis not present

## 2021-02-24 DIAGNOSIS — M810 Age-related osteoporosis without current pathological fracture: Secondary | ICD-10-CM | POA: Diagnosis not present

## 2021-02-24 DIAGNOSIS — Z13820 Encounter for screening for osteoporosis: Secondary | ICD-10-CM

## 2021-02-24 DIAGNOSIS — Z6830 Body mass index (BMI) 30.0-30.9, adult: Secondary | ICD-10-CM | POA: Diagnosis not present

## 2021-02-24 DIAGNOSIS — E782 Mixed hyperlipidemia: Secondary | ICD-10-CM

## 2021-02-24 DIAGNOSIS — Z0000 Encounter for general adult medical examination without abnormal findings: Secondary | ICD-10-CM

## 2021-02-24 DIAGNOSIS — I2510 Atherosclerotic heart disease of native coronary artery without angina pectoris: Secondary | ICD-10-CM

## 2021-02-24 DIAGNOSIS — I119 Hypertensive heart disease without heart failure: Secondary | ICD-10-CM

## 2021-02-24 DIAGNOSIS — Z780 Asymptomatic menopausal state: Secondary | ICD-10-CM

## 2021-02-24 NOTE — Patient Instructions (Addendum)
Continue home medications Return in morning for fasting labs Recommend eye exam We will call you with lab results, mammogram and DEXA scan appointments Follow-up in 83-month or sooner if needed  Bone Health Bones protect organs, store calcium, anchor muscles, and support the whole body. Keeping your bones strong is important, especially as you get older. You can take actions to help keep your bones strong and healthy. Why is keeping my bones healthy important? Keeping your bones healthy is important because your body constantly replaces bone cells. Cells get old, and new cells take their place. As we age, we lose bone cells because the body may not be able to make enough new cells to replace the old cells. The amount of bone cells and bone tissue you have is referred to as bone mass. The higher your bone mass, the stronger your bones. The aging process leads to an overall loss of bone mass in the body, which can increase the likelihood of:  Joint pain and stiffness.  Broken bones.  A condition in which the bones become weak and brittle (osteoporosis). A large decline in bone mass occurs in older adults. In women, it occurs about the time of menopause.   What actions can I take to keep my bones healthy? Good health habits are important for maintaining healthy bones. This includes eating nutritious foods and exercising regularly. To have healthy bones, you need to get enough of the right minerals and vitamins. Most nutrition experts recommend getting these nutrients from the foods that you eat. In some cases, taking supplements may also be recommended. Doing certain types of exercise is also important for bone health. What are the nutritional recommendations for healthy bones? Eating a well-balanced diet with plenty of calcium and vitamin D will help to protect your bones. Nutritional recommendations vary from person to person. Ask your health care provider what is healthy for you. Here are some  general guidelines. Get enough calcium Calcium is the most important (essential) mineral for bone health. Most people can get enough calcium from their diet, but supplements may be recommended for people who are at risk for osteoporosis. Good sources of calcium include:  Dairy products, such as low-fat or nonfat milk, cheese, and yogurt.  Dark green leafy vegetables, such as bok choy and broccoli.  Calcium-fortified foods, such as orange juice, cereal, bread, soy beverages, and tofu products.  Nuts, such as almonds. Follow these recommended amounts for daily calcium intake:  Children, age 942-3: 700 mg.  Children, age 72-8: 1,000 mg.  Children, age 72-13 1,300 mg.  Teens, age 942105-18 1,300 mg.  Adults, age 94255-50 1,000 mg.  Adults, age 72-70 ? Men: 1,000 mg. ? Women: 1,200 mg.  Adults, age 70580or older: 1,200 mg.  Pregnant and breastfeeding females: ? Teens: 1,300 mg. ? Adults: 1,000 mg. Get enough vitamin D Vitamin D is the most essential vitamin for bone health. It helps the body absorb calcium. Sunlight stimulates the skin to make vitamin D, so be sure to get enough sunlight. If you live in a cold climate or you do not get outside often, your health care provider may recommend that you take vitamin D supplements. Good sources of vitamin D in your diet include:  Egg yolks.  Saltwater fish.  Milk and cereal fortified with vitamin D. Follow these recommended amounts for daily vitamin D intake:  Children and teens, age 942-18: 600 international units.  Adults, age 4867or younger: 400-800 international units.  Adults, age 4881or  older: 800-1,000 international units. Get other important nutrients Other nutrients that are important for bone health include:  Phosphorus. This mineral is found in meat, poultry, dairy foods, nuts, and legumes. The recommended daily intake for adult men and adult women is 700 mg.  Magnesium. This mineral is found in seeds, nuts, dark green  vegetables, and legumes. The recommended daily intake for adult men is 400-420 mg. For adult women, it is 310-320 mg.  Vitamin K. This vitamin is found in green leafy vegetables. The recommended daily intake is 120 mg for adult men and 90 mg for adult women.   What type of physical activity is best for building and maintaining healthy bones? Weight-bearing and strength-building activities are important for building and maintaining healthy bones. Weight-bearing activities cause muscles and bones to work against gravity. Strength-building activities increase the strength of the muscles that support bones. Weight-bearing and muscle-building activities include:  Walking and hiking.  Jogging and running.  Dancing.  Gym exercises.  Lifting weights.  Tennis and racquetball.  Climbing stairs.  Aerobics. Adults should get at least 30 minutes of moderate physical activity on most days. Children should get at least 60 minutes of moderate physical activity on most days. Ask your health care provider what type of exercise is best for you.   How can I find out if my bone mass is low? Bone mass can be measured with an X-ray test called a bone mineral density (BMD) test. This test is recommended for all women who are age 8 or older. It may also be recommended for:  Men who are age 25 or older.  People who are at risk for osteoporosis because of: ? Having bones that break easily. ? Having a long-term disease that weakens bones, such as kidney disease or rheumatoid arthritis. ? Having menopause earlier than normal. ? Taking medicine that weakens bones, such as steroids, thyroid hormones, or hormone treatment for breast cancer or prostate cancer. ? Smoking. ? Drinking three or more alcoholic drinks a day. If you find that you have a low bone mass, you may be able to prevent osteoporosis or further bone loss by changing your diet and lifestyle. Where can I find more information? For more  information, check out the following websites:  [place]: [employer]fr  [employer] Health: www.bones.SouthExposed.es  International Osteoporosis Foundation: Administrator.iofbonehealth.org Summary  The aging process leads to an overall loss of bone mass in the body, which can increase the likelihood of broken bones and osteoporosis.  Eating a well-balanced diet with plenty of calcium and vitamin D will help to protect your bones.  Weight-bearing and strength-building activities are also important for building and maintaining strong bones.  Bone mass can be measured with an X-ray test called a bone mineral density (BMD) test. This information is not intended to replace advice given to you by your health care provider. Make sure you discuss any questions you have with your health care provider. Document Revised: 11/14/2017 Document Reviewed: 11/14/2017 Elsevier Patient Education  2021 [place]. Bone Density Test A bone density test uses a type of X-ray to measure the amount of calcium and other minerals in a person's bones. It can measure bone density in the hip and the spine. The test is similar to having a regular X-ray. This test may also be called:  Bone densitometry.  Bone mineral density test.  Dual-energy X-ray absorptiometry (DEXA). You may have this test to:  Diagnose a condition that causes weak or thin  bones (osteoporosis).  Screen you for osteoporosis.  Predict your risk for a broken bone (fracture).  Determine how well your osteoporosis treatment is working. Tell a health care provider about:  Any allergies you have.  All medicines you are taking, including vitamins, herbs, eye drops, creams, and over-the-counter medicines.  Any problems you or family members have had with anesthetic medicines.  Any blood disorders you have.  Any surgeries you have had.  Any medical conditions you have.  Whether you are pregnant or may be  pregnant.  Any medical tests you have had within the past 14 days that used contrast material. What are the risks? Generally, this is a safe test. However, it does expose you to a small amount of radiation, which can slightly increase your cancer risk. What happens before the test?  Do not take any calcium supplements within the 24 hours before your test.  You will need to remove all metal jewelry, eyeglasses, removable dental appliances, and any other metal objects on your body. What happens during the test?  You will lie down on an exam table. There will be an X-ray generator below you and an imaging device above you.  Other devices, such as boxes or braces, may be used to position your body properly for the scan.  The machine will slowly scan your body. You will need to keep very still while the machine does the scan.  The images will show up on a screen in the room. Images will be examined by a specialist after your test is finished. The procedure may vary among health care providers and hospitals.   What can I expect after the test? It is up to you to get the results of your test. Ask your health care provider, or the department that is doing the test, when your results will be ready. Summary  A bone density test is an imaging test that uses a type of X-ray to measure the amount of calcium and other minerals in your bones.  The test may be used to diagnose or screen you for a condition that causes weak or thin bones (osteoporosis), predict your risk for a broken bone (fracture), or determine how well your osteoporosis treatment is working.  Do not take any calcium supplements within 24 hours before your test.  Ask your health care provider, or the department that is doing the test, when your results will be ready. This information is not intended to replace advice given to you by your health care provider. Make sure you discuss any questions you have with your health care  provider. Document Revised: 04/03/2020 Document Reviewed: 04/03/2020 Elsevier Patient Education  [place] 65 Years and Older, Female Preventive care refers to lifestyle choices and visits with your health care provider that can promote health and wellness. This includes:  A yearly physical exam. This is also called an annual wellness visit.  Regular dental and eye exams.  Immunizations.  Screening for certain conditions.  Healthy lifestyle choices, such as: ? Eating a healthy diet. ? Getting regular exercise. ? Not using drugs or products that contain nicotine and tobacco. ? Limiting alcohol use. What can I expect for my preventive care visit? Physical exam Your health care provider will check your:  Height and weight. These may be used to calculate your BMI (body mass index). BMI is a measurement that tells if you are at a healthy weight.  Heart rate and blood pressure.  Body temperature.  Skin  for abnormal spots. Counseling Your health care provider may ask you questions about your:  Past medical problems.  Family's medical history.  Alcohol, tobacco, and drug use.  Emotional well-being.  Home life and relationship well-being.  Sexual activity.  Diet, exercise, and sleep habits.  History of falls.  Memory and ability to understand (cognition).  Work and work Statistician.  Pregnancy and menstrual history.  Access to firearms. What immunizations do I need? Vaccines are usually given at various ages, according to a schedule. Your health care provider will recommend vaccines for you based on your age, medical history, and lifestyle or other factors, such as travel or where you work.   What tests do I need? Blood tests  Lipid and cholesterol levels. These may be checked every 5 years, or more often depending on your overall health.  Hepatitis C test.  Hepatitis B test. Screening  Lung cancer screening. You may have this  screening every year starting at age 14 if you have a 30-pack-year history of smoking and currently smoke or have quit within the past 15 years.  Colorectal cancer screening. ? All adults should have this screening starting at age 43 and continuing until age 73. ? Your health care provider may recommend screening at age 62 if you are at increased risk. ? You will have tests every 1-10 years, depending on your results and the type of screening test.  Diabetes screening. ? This is done by checking your blood sugar (glucose) after you have not eaten for a while (fasting). ? You may have this done every 1-3 years.  Mammogram. ? This may be done every 1-2 years. ? Talk with your health care provider about how often you should have regular mammograms.  Abdominal aortic aneurysm (AAA) screening. You may need this if you are a current or former smoker.  BRCA-related cancer screening. This may be done if you have a family history of breast, ovarian, tubal, or peritoneal cancers. Other tests  STD (sexually transmitted disease) testing, if you are at risk.  Bone density scan. This is done to screen for osteoporosis. You may have this done starting at age 61. Talk with your health care provider about your test results, treatment options, and if necessary, the need for more tests. Follow these instructions at home: Eating and drinking  Eat a diet that includes fresh fruits and vegetables, whole grains, lean protein, and low-fat dairy products. Limit your intake of foods with high amounts of sugar, saturated fats, and salt.  Take vitamin and mineral supplements as recommended by your health care provider.  Do not drink alcohol if your health care provider tells you not to drink.  If you drink alcohol: ? Limit how much you have to 0-1 drink a day. ? Be aware of how much alcohol is in your drink. In the [place] one drink equals one 12 oz bottle of beer (355 mL), one 5 oz glass of wine (148 mL), or  one 1 oz glass of hard liquor (44 mL).   Lifestyle  Take daily care of your teeth and gums. Brush your teeth every morning and night with fluoride toothpaste. Floss one time each day.  Stay active. Exercise for at least 30 minutes 5 or more days each week.  Do not use any products that contain nicotine or tobacco, such as cigarettes, e-cigarettes, and chewing tobacco. If you need help quitting, ask your health care provider.  Do not use drugs.  If you are sexually active, practice  safe sex. Use a condom or other form of protection in order to prevent STIs (sexually transmitted infections).  Talk with your health care provider about taking a low-dose aspirin or statin.  Find healthy ways to cope with stress, such as: ? Meditation, yoga, or listening to music. ? Journaling. ? Talking to a trusted person. ? Spending time with friends and family. Safety  Always wear your seat belt while driving or riding in a vehicle.  Do not drive: ? If you have been drinking alcohol. Do not ride with someone who has been drinking. ? When you are tired or distracted. ? While texting.  Wear a helmet and other protective equipment during sports activities.  If you have firearms in your house, make sure you follow all gun safety procedures. What's next?  Visit your health care provider once a year for an annual wellness visit.  Ask your health care provider how often you should have your eyes and teeth checked.  Stay up to date on all vaccines. This information is not intended to replace advice given to you by your health care provider. Make sure you discuss any questions you have with your health care provider. Document Revised: 10/08/2020 Document Reviewed: 10/12/2018 Elsevier Patient Education  2021 [employer].

## 2021-02-24 NOTE — Progress Notes (Signed)
Subjective:  Patient ID: Alexandra Barajas, female    DOB: [birth year]  Age: 70 y.o. MRN: [id number]  Chief Complaint  Patient presents with  . FL2 Forms  . Hyperlipidemia  . Hypertension  . Gastroesophageal Reflux  . Hyperthyroidism    HPI Alexandra Barajas is a 70 year old Caucasian female that presents for annual physical exam. She is a resident of [facility] and is accompanied by staff member who assists with medical history questions. Alexandra Barajas is due for screening mammogram, DEXA scan, and eye exam. She denies any acute medical illnesses today. She has not fasted for today's appointment. She will return in the morning for fasting lipid panel.  Encounter for general adult medical examination without abnormal findings  Physical ("At Risk" items are starred): Patient's last physical exam was 1 year ago .    SDOH Screenings   Alcohol Screen: Low Risk   . Last Alcohol Screening Score (AUDIT): 0  Depression (PHQ2-9): Low Risk   . PHQ-2 Score: 0  Financial Resource Strain: Not on file  Food Insecurity: Not on file  Housing: Not on file  Physical Activity: Not on file  Social Connections: Not on file  Stress: Not on file  Tobacco Use: Low Risk   . Smoking Tobacco Use: Never Smoker  . Smokeless Tobacco Use: Never Used  Transportation Needs: Not on file    Fall Risk  02/24/2021 02/27/2020 02/25/2020 09/18/2018 06/25/2016  Falls in the past year? 0 0 1 0 No  Comment - - - [facility] Telephone Survey: data to providers prior to load [employer] Telephone Survey: data to providers prior to load  Number falls in past yr: 0 0 0 - -  Injury with Fall? 0 0 0 - -  Risk for fall due to : No Fall Risks - - - -  Follow up Falls evaluation completed Falls evaluation completed - - -    Depression screen [facility] 2/9 02/24/2021 03/21/2020 02/27/2020 02/25/2020  Decreased Interest 0 0 0 0  Down, Depressed, Hopeless 0 0 0 0  PHQ - 2 Score 0 0 0 0  Altered sleeping 0 - - -  Tired, decreased energy 0 - - -  Change in  appetite 0 - - -  Feeling bad or failure about yourself  0 - - -  Trouble concentrating 0 - - -  Moving slowly or fidgety/restless 0 - - -  Suicidal thoughts 0 - - -  PHQ-9 Score 0 - - -  Difficult doing work/chores Not difficult at all - - -    Functional Status Survey:     Safety: reviewed ;  Patient wears a seat belt. Patient's home has smoke detectors and carbon monoxide detectors. Patient wears sunscreen with extended sun exposure. Dental Care: biannual cleanings, brushes and flosses daily. Ophthalmology/Optometry: 2019 Hearing loss: none Vision impairments: wears glasses Patient is not afflicted from Stress Incontinence and Urge Incontinence   Current Outpatient Medications on File Prior to Visit  Medication Sig Dispense Refill  . Calcium Carbonate-Vitamin D 600-400 MG-UNIT tablet Take 1 tablet by mouth in the morning and at bedtime. 60 tablet 11  . chlorhexidine (PERIDEX) 0.12 % solution     . Docusate Sodium (DSS) 100 MG CAPS Take 1 tablet by mouth daily. 30 capsule 11  . ezetimibe (ZETIA) 10 MG tablet TAKE 1 TABLET BY MOUTH ONCE DAILY 30 tablet 11  . FLUoxetine (PROZAC) 20 MG capsule TAKE 4 CAPSULES (80 MG) BY MOUTH EVERY MORNING 120 capsule 11  . hydrochlorothiazide (  HYDRODIURIL) 12.5 MG tablet TAKE 1 TABLET BY MOUTH ONCE DAILY 30 tablet 11  . lactulose (CHRONULAC) 10 GM/15ML solution TAKE 15 ML BY MOUTH ONCE DAILY 473 mL 10  . levothyroxine (SYNTHROID) 50 MCG tablet TAKE 1 TABLET BY MOUTH EVERY DAY 30 tablet 6  . losartan (COZAAR) 50 MG tablet TAKE 1 TABLET BY MOUTH ONCE DAILY 30 tablet 6  . metoprolol succinate (TOPROL-XL) 25 MG 24 hr tablet Take 1 tablet (25 mg total) by mouth daily. 90 tablet 3  . omeprazole (PRILOSEC) 40 MG capsule Take 1 capsule (40 mg total) by mouth daily. 90 capsule 0  . Prenatal Vit-Fe Fumarate-FA (MULTIVITAMIN-PRENATAL) 27-0.8 MG TABS tablet Take 1 tablet by mouth daily at 12 noon. 30 tablet 11  . ranolazine (RANEXA) 500 MG 12 hr tablet Take by  mouth.    . Wheat Dextrin (BENEFIBER) POWD Stir 2 teaspoons of Benefiber into 4-8 oz of your favorite non-carbonated beverage or soft food (hot or cold). Stir well until dissolved. 2 teaspoons three times daily. Do not exceed 6 teaspoons per day. 730 g 11   No current facility-administered medications on file prior to visit.    Social Hx   Social History   Socioeconomic History  . Marital status: Single    Spouse name: Not on file  . Number of children: Not on file  . Years of education: Not on file  . Highest education level: Not on file  Occupational History  . Not on file  Tobacco Use  . Smoking status: Never Smoker  . Smokeless tobacco: Never Used  Substance and Sexual Activity  . Alcohol use: Never  . Drug use: Never  . Sexual activity: Not Currently  Other Topics Concern  . Not on file  Social History Narrative   Lives at [place] Strain: Not on file  Food Insecurity: Not on file  Transportation Needs: Not on file  Physical Activity: Not on file  Stress: Not on file  Social Connections: Not on file   Past Medical History:  Diagnosis Date  . Aortic valve disease    S/P aortic valve replacement  . 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 Atrophy of thyroid   . Coronary artery disease 2003   cabg  . Depression   . Hyperlipidemia   . Hypertension   . Hypertensive heart disease without heart failure   . Moderate intellectual disabilities   . Osteoporosis   . Other nonrheumatic aortic valve disorders   . Other specified disorders of bone density and structure, multiple sites   . Presence of aortocoronary bypass graft   . Presence of prosthetic heart valve    Family History  Family history unknown: Yes    Review of Systems  Constitutional: Negative for appetite change, fatigue and unexpected weight change.  HENT: Negative for congestion, ear pain, rhinorrhea,  sinus pressure, sinus pain and tinnitus.   Eyes: Negative for pain.  Respiratory: Negative for cough and shortness of breath.   Cardiovascular: Negative for chest pain, palpitations and leg swelling.  Gastrointestinal: Negative for abdominal pain, constipation, diarrhea, nausea and vomiting.  Endocrine: Negative for cold intolerance, heat intolerance, polydipsia, polyphagia and polyuria.  Genitourinary: Negative for dysuria, frequency and hematuria.  Musculoskeletal: Negative for arthralgias, back pain, joint swelling and myalgias.  Skin: Negative for rash.  Allergic/Immunologic: Negative for environmental allergies.  Neurological: Negative for dizziness and headaches.  Hematological: Negative  for adenopathy.  Psychiatric/Behavioral: Negative for decreased concentration and sleep disturbance. The patient is not nervous/anxious.      Objective:  BP 126/74   Pulse 87   Temp (!) 97.1 F (36.2 C)   Resp 16   Ht 5' 3.78" (1.62 m)   Wt 174 lb (78.9 kg)   SpO2 98%   BMI 30.07 kg/m   BP/Weight 02/24/2021 03/21/2020 1/93/7902  Systolic BP 409 735 329  Diastolic BP 74 68 70  Wt. (Lbs) 174 177 178  BMI 30.07 28.57 31.28    Physical Exam Vitals reviewed.  Constitutional:      Appearance: Normal appearance.  HENT:     Head: Normocephalic.     Right Ear: Tympanic membrane normal.     Left Ear: Tympanic membrane normal.     Nose: Nose normal.     Mouth/Throat:     Mouth: Mucous membranes are moist.  Eyes:     Pupils: Pupils are equal, round, and reactive to light.     Comments: Eye glasses in place  Cardiovascular:     Rate and Rhythm: Normal rate and regular rhythm.     Pulses: Normal pulses.     Heart sounds: Normal heart sounds.  Pulmonary:     Effort: Pulmonary effort is normal.     Breath sounds: Normal breath sounds.  Abdominal:     General: Bowel sounds are normal.     Palpations: Abdomen is soft.  Musculoskeletal:        General: Normal range of motion.      Cervical back: Neck supple.  Skin:    General: Skin is warm and dry.     Capillary Refill: Capillary refill takes less than 2 seconds.  Neurological:     General: No focal deficit present.     Mental Status: She is alert and oriented to person, place, and time.  Psychiatric:        Mood and Affect: Mood normal.        Behavior: Behavior normal.     Lab Results  Component Value Date   WBC 6.6 03/21/2020   HGB 14.3 03/21/2020   HCT 43.7 03/21/2020   PLT 143 (L) 03/21/2020   GLUCOSE 103 (H) 03/21/2020   CHOL 255 (H) 03/21/2020   TRIG 110 03/21/2020   HDL 56 03/21/2020   LDLCALC 180 (H) 03/21/2020   ALT 98 (H) 03/21/2020   AST 100 (H) 03/21/2020   NA 135 03/21/2020   K 4.6 03/21/2020   CL 95 (L) 03/21/2020   CREATININE 0.83 03/21/2020   BUN 12 03/21/2020   CO2 27 03/21/2020   TSH 1.720 12/05/2019   HGBA1C 6.1 (H) 03/21/2020      Assessment & Plan:   1. Physical exam - CBC with Differential/Platelet - Comprehensive metabolic panel - TSH - VITAMIN D 25 Hydroxy (Vit-D Deficiency, Fractures) - MM DIGITAL SCREENING BILATERAL - DG Bone Density - Lipid Panel; Future  2. Age-related osteoporosis without current pathological fracture - VITAMIN D 25 Hydroxy (Vit-D Deficiency, Fractures)  3. BMI 30.0-30.9,adult - VITAMIN D 25 Hydroxy (Vit-D Deficiency, Fractures)  4. Encounter for screening mammogram for malignant neoplasm of breast - MM DIGITAL SCREENING BILATERAL  5. Encounter for osteoporosis screening in asymptomatic postmenopausal patient - DG Bone Density   These are the goals we discussed: Goals   Obtain eye exam, screening mammogram and DEXA scan      This is a list of the screening recommended for you and due dates:  Health Maintenance  Topic Date Due  .  Hepatitis C: One time screening is recommended by Center for Disease Control  (CDC) for  adults born from [birth year] through 1967.   Never done  . Flu Shot  06/01/2021  . Mammogram  01/01/2022  . Tetanus  Vaccine  01/09/2026  . DEXA scan (bone density measurement)  Completed  . COVID-19 Vaccine  Completed  . Pneumonia vaccines  Completed  . HPV Vaccine  Aged Out  . Colon Cancer Screening  Discontinued    Continue home medications Return in morning for fasting labs Recommend eye exam We will call you with lab results, mammogram and DEXA scan appointments Follow-up in 40-month or sooner if needed   AN INDIVIDUALIZED CARE PLAN: was established or reinforced today.   SELF MANAGEMENT: The patient and I together assessed ways to personally work towards obtaining the recommended goals  Support needs The patient and/or family needs were assessed and services were offered and not necessary at this time.    Follow-up: Return in about 6 months (around 08/26/2021) for fasting. [phone]

## 2021-02-24 NOTE — Telephone Encounter (Signed)
Scheduled with Larene Beach. kc

## 2021-02-24 NOTE — Progress Notes (Deleted)
Subjective:  Patient ID: Alexandra Barajas, female    DOB: [birth year]  Age: 70 y.o. MRN: [id number]  Chief Complaint  Patient presents with  . FL2 Forms  . Hyperlipidemia  . Hypertension  . Gastroesophageal Reflux  . Hyperthyroidism    HPI   Current Outpatient Medications on File Prior to Visit  Medication Sig Dispense Refill  . Calcium Carbonate-Vitamin D 600-400 MG-UNIT tablet Take 1 tablet by mouth in the morning and at bedtime. 60 tablet 11  . chlorhexidine (PERIDEX) 0.12 % solution     . Docusate Sodium (DSS) 100 MG CAPS Take 1 tablet by mouth daily. 30 capsule 11  . ezetimibe (ZETIA) 10 MG tablet TAKE 1 TABLET BY MOUTH ONCE DAILY 30 tablet 11  . FLUoxetine (PROZAC) 20 MG capsule TAKE 4 CAPSULES (80 MG) BY MOUTH EVERY MORNING 120 capsule 11  . hydrochlorothiazide (HYDRODIURIL) 12.5 MG tablet TAKE 1 TABLET BY MOUTH ONCE DAILY 30 tablet 11  . lactulose (CHRONULAC) 10 GM/15ML solution TAKE 15 ML BY MOUTH ONCE DAILY 473 mL 10  . levothyroxine (SYNTHROID) 50 MCG tablet TAKE 1 TABLET BY MOUTH EVERY DAY 30 tablet 6  . losartan (COZAAR) 50 MG tablet TAKE 1 TABLET BY MOUTH ONCE DAILY 30 tablet 6  . metoprolol succinate (TOPROL-XL) 25 MG 24 hr tablet Take 1 tablet (25 mg total) by mouth daily. 90 tablet 3  . omeprazole (PRILOSEC) 40 MG capsule Take 1 capsule (40 mg total) by mouth daily. 90 capsule 0  . Prenatal Vit-Fe Fumarate-FA (MULTIVITAMIN-PRENATAL) 27-0.8 MG TABS tablet Take 1 tablet by mouth daily at 12 noon. 30 tablet 11  . ranolazine (RANEXA) 500 MG 12 hr tablet Take by mouth.    . Wheat Dextrin (BENEFIBER) POWD Stir 2 teaspoons of Benefiber into 4-8 oz of your favorite non-carbonated beverage or soft food (hot or cold). Stir well until dissolved. 2 teaspoons three times daily. Do not exceed 6 teaspoons per day. 730 g 11   No current facility-administered medications on file prior to visit.   Past Medical History:  Diagnosis Date  . Aortic valve disease    S/P aortic valve  replacement  . 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 Atrophy of thyroid   . Coronary artery disease 2003   cabg  . Depression   . Hyperlipidemia   . Hypertension   . Hypertensive heart disease without heart failure   . Moderate intellectual disabilities   . Osteoporosis   . Other nonrheumatic aortic valve disorders   . Other specified disorders of bone density and structure, multiple sites   . Presence of aortocoronary bypass graft   . Presence of prosthetic heart valve    Past Surgical History:  Procedure Laterality Date  . ABDOMINAL HYSTERECTOMY     BL oophorectomy  . ANKLE SURGERY Left   . CARDIAC VALVE REPLACEMENT     2003 (aortic valve and mitral valve replacements.)  . CORONARY ARTERY BYPASS GRAFT      Family History  Family history unknown: Yes   Social History   Socioeconomic History  . Marital status: Single    Spouse name: Not on file  . Number of children: Not on file  . Years of education: Not on file  . Highest education level: Not on file  Occupational History  . Not on file  Tobacco Use  . Smoking status: Never Smoker  . Smokeless tobacco: Never Used  Substance and Sexual Activity  . Alcohol use:  Never  . Drug use: Never  . Sexual activity: Not Currently  Other Topics Concern  . Not on file  Social History Narrative   Lives at [place] Strain: Not on file  Food Insecurity: Not on file  Transportation Needs: Not on file  Physical Activity: Not on file  Stress: Not on file  Social Connections: Not on file    Review of Systems  Constitutional: Negative for chills, fatigue and fever.  HENT: Negative for congestion, ear pain, rhinorrhea and sore throat.   Respiratory: Negative for cough and shortness of breath.   Cardiovascular: Negative for chest pain.  Gastrointestinal: Negative for abdominal pain, constipation, diarrhea, nausea and  vomiting.  Psychiatric/Behavioral: The patient is not nervous/anxious.      Objective:  Pulse 87   Temp (!) 97.1 F (36.2 C)   Ht 5' 3.78" (1.62 m)   Wt 174 lb (78.9 kg)   SpO2 98%   BMI 30.07 kg/m   BP/Weight 02/24/2021 03/21/2020 04/02/931  Systolic BP - 355 732  Diastolic BP - 68 70  Wt. (Lbs) 174 177 178  BMI 30.07 28.57 31.28    Physical Exam  Diabetic Foot Exam - Simple   No data filed      Lab Results  Component Value Date   WBC 6.6 03/21/2020   HGB 14.3 03/21/2020   HCT 43.7 03/21/2020   PLT 143 (L) 03/21/2020   GLUCOSE 103 (H) 03/21/2020   CHOL 255 (H) 03/21/2020   TRIG 110 03/21/2020   HDL 56 03/21/2020   LDLCALC 180 (H) 03/21/2020   ALT 98 (H) 03/21/2020   AST 100 (H) 03/21/2020   NA 135 03/21/2020   K 4.6 03/21/2020   CL 95 (L) 03/21/2020   CREATININE 0.83 03/21/2020   BUN 12 03/21/2020   CO2 27 03/21/2020   TSH 1.720 12/05/2019   HGBA1C 6.1 (H) 03/21/2020      Assessment & Plan:   There are no diagnoses linked to this encounter.   No orders of the defined types were placed in this encounter.   No orders of the defined types were placed in this encounter.    Follow-up: No follow-ups on file.  An After Visit Summary was printed and given to the patient.  Rip Harbour, NP [place] [phone]

## 2021-02-24 NOTE — Telephone Encounter (Signed)
Gwen Womack from the group home called asking if we could fill out a FL2 form for Alexandra Barajas today because her FL2 runs out today and schedule her for a later day for a Physical to see you? Please Advise, Alexandra Barajas is faxing this over to me directly in case we can fill it out today. [place]

## 2021-02-25 ENCOUNTER — Other Ambulatory Visit: Payer: Medicare Other

## 2021-02-25 ENCOUNTER — Other Ambulatory Visit: Payer: Self-pay | Admitting: Nurse Practitioner

## 2021-02-25 DIAGNOSIS — Z0000 Encounter for general adult medical examination without abnormal findings: Secondary | ICD-10-CM

## 2021-02-25 LAB — COMPREHENSIVE METABOLIC PANEL
ALT: 51 IU/L — ABNORMAL HIGH (ref 0–32)
AST: 49 IU/L — ABNORMAL HIGH (ref 0–40)
Albumin/Globulin Ratio: 1.3 (ref 1.2–2.2)
Albumin: 4.1 g/dL (ref 3.7–4.7)
Alkaline Phosphatase: 94 IU/L (ref 44–121)
BUN/Creatinine Ratio: 17 (ref 12–28)
BUN: 14 mg/dL (ref 8–27)
Bilirubin Total: 0.3 mg/dL (ref 0.0–1.2)
CO2: 23 mmol/L (ref 20–29)
Calcium: 9.3 mg/dL (ref 8.7–10.3)
Chloride: 94 mmol/L — ABNORMAL LOW (ref 96–106)
Creatinine, Ser: 0.84 mg/dL (ref 0.57–1.00)
Globulin, Total: 3.2 g/dL (ref 1.5–4.5)
Glucose: 124 mg/dL — ABNORMAL HIGH (ref 65–99)
Potassium: 4.5 mmol/L (ref 3.5–5.2)
Sodium: 134 mmol/L (ref 134–144)
Total Protein: 7.3 g/dL (ref 6.0–8.5)
eGFR: 74 mL/min/{1.73_m2} (ref >59)

## 2021-02-25 LAB — CBC WITH DIFFERENTIAL/PLATELET
Basophils Absolute: 0 10*3/uL (ref 0.0–0.2)
Basos: 1 %
EOS (ABSOLUTE): 0.2 10*3/uL (ref 0.0–0.4)
Eos: 3 %
Hematocrit: 40.1 % (ref 34.0–46.6)
Hemoglobin: 13.4 g/dL (ref 11.1–15.9)
Immature Grans (Abs): 0 10*3/uL (ref 0.0–0.1)
Immature Granulocytes: 0 %
Lymphocytes Absolute: 2.4 10*3/uL (ref 0.7–3.1)
Lymphs: 36 %
MCH: 31.6 pg (ref 26.6–33.0)
MCHC: 33.4 g/dL (ref 31.5–35.7)
MCV: 95 fL (ref 79–97)
Monocytes Absolute: 1.1 10*3/uL — ABNORMAL HIGH (ref 0.1–0.9)
Monocytes: 17 %
Neutrophils Absolute: 2.9 10*3/uL (ref 1.4–7.0)
Neutrophils: 43 %
Platelets: 141 10*3/uL — ABNORMAL LOW (ref 150–450)
RBC: 4.24 x10E6/uL (ref 3.77–5.28)
RDW: 12.2 % (ref 11.7–15.4)
WBC: 6.6 10*3/uL (ref 3.4–10.8)

## 2021-02-25 LAB — VITAMIN D 25 HYDROXY (VIT D DEFICIENCY, FRACTURES): Vit D, 25-Hydroxy: 35 ng/mL (ref 30.0–100.0)

## 2021-02-25 LAB — TSH: TSH: 3.27 u[IU]/mL (ref 0.450–4.500)

## 2021-02-25 NOTE — Addendum Note (Signed)
Addended by: Rip Harbour on: 02/25/2021 11:03 AM   Modules accepted: Orders

## 2021-02-26 LAB — CARDIOVASCULAR RISK ASSESSMENT

## 2021-02-26 LAB — LIPID PANEL
Chol/HDL Ratio: 4.8 ratio — ABNORMAL HIGH (ref 0.0–4.4)
Cholesterol, Total: 250 mg/dL — ABNORMAL HIGH (ref 100–199)
HDL: 52 mg/dL (ref >39)
LDL Chol Calc (NIH): 175 mg/dL — ABNORMAL HIGH (ref 0–99)
Triglycerides: 127 mg/dL (ref 0–149)
VLDL Cholesterol Cal: 23 mg/dL (ref 5–40)

## 2021-03-23 ENCOUNTER — Ambulatory Visit
Admission: RE | Admit: 2021-03-23 | Discharge: 2021-03-23 | Disposition: A | Discharge status: Home / Self Care | Payer: Medicare Other | Source: Ambulatory Visit | Attending: Family Medicine | Admitting: Family Medicine

## 2021-03-23 ENCOUNTER — Other Ambulatory Visit: Payer: Self-pay

## 2021-03-24 ENCOUNTER — Other Ambulatory Visit: Payer: Self-pay

## 2021-03-26 ENCOUNTER — Other Ambulatory Visit: Payer: Self-pay

## 2021-03-26 ENCOUNTER — Other Ambulatory Visit: Payer: Self-pay | Admitting: Nurse Practitioner

## 2021-03-26 DIAGNOSIS — R928 Other abnormal and inconclusive findings on diagnostic imaging of breast: Secondary | ICD-10-CM

## 2021-03-31 ENCOUNTER — Other Ambulatory Visit: Payer: Self-pay | Admitting: Family Medicine

## 2021-03-31 DIAGNOSIS — R928 Other abnormal and inconclusive findings on diagnostic imaging of breast: Secondary | ICD-10-CM

## 2021-04-02 ENCOUNTER — Other Ambulatory Visit: Payer: Self-pay

## 2021-04-02 ENCOUNTER — Ambulatory Visit
Admission: RE | Admit: 2021-04-02 | Discharge: 2021-04-02 | Disposition: A | Discharge status: Home / Self Care | Payer: Medicare Other | Source: Ambulatory Visit | Attending: Family Medicine | Admitting: Family Medicine

## 2021-04-02 ENCOUNTER — Other Ambulatory Visit: Payer: Self-pay | Admitting: Family Medicine

## 2021-04-02 DIAGNOSIS — R921 Mammographic calcification found on diagnostic imaging of breast: Secondary | ICD-10-CM

## 2021-04-02 DIAGNOSIS — R928 Other abnormal and inconclusive findings on diagnostic imaging of breast: Secondary | ICD-10-CM

## 2021-07-20 ENCOUNTER — Other Ambulatory Visit: Payer: Self-pay

## 2021-07-20 ENCOUNTER — Encounter: Payer: Self-pay | Admitting: Family Medicine

## 2021-07-20 ENCOUNTER — Ambulatory Visit (INDEPENDENT_AMBULATORY_CARE_PROVIDER_SITE_OTHER): Payer: Medicare Other | Admitting: Family Medicine

## 2021-07-20 VITALS — BP 110/80 | HR 90 | Temp 97.1°F | Resp 14 | Ht 64.0 in | Wt 164.0 lb

## 2021-07-20 DIAGNOSIS — Z23 Encounter for immunization: Secondary | ICD-10-CM

## 2021-07-20 DIAGNOSIS — Z0000 Encounter for general adult medical examination without abnormal findings: Secondary | ICD-10-CM | POA: Diagnosis not present

## 2021-07-20 DIAGNOSIS — Z6828 Body mass index (BMI) 28.0-28.9, adult: Secondary | ICD-10-CM

## 2021-07-20 DIAGNOSIS — Z0001 Encounter for general adult medical examination with abnormal findings: Secondary | ICD-10-CM

## 2021-07-20 MED ORDER — FUROSEMIDE 20 MG PO TABS: 20.0000 mg | ORAL_TABLET | Freq: Every day | ORAL | 1 refills | Status: DC

## 2021-07-20 NOTE — Progress Notes (Signed)
Subjective:  Patient ID: Alexandra Barajas, female    DOB: 10-Feb-1951  Age: 70 y.o. MRN: [id number]  Chief Complaint  Patient presents with   AWV    HPI Well Adult Physical: Patient here for a comprehensive physical exam.The patient reports no problems Do you take any herbs or supplements that were not prescribed by a doctor? no Are you taking calcium supplements? yes Are you taking aspirin daily? no  Encounter for general adult medical examination without abnormal findings  Physical ("At Risk" items are starred): Patient's last physical exam was 1 year ago .  Smoking: Life-long non-smoker ;  Physical Activity: Does not have a set exercise routine;  Alcohol/Drug Use: Is a non-drinker ; No illicit drug use ;  Patient is not afflicted from Stress Incontinence and Urge Incontinence  Safety: reviewed. Patient wears a seat belt, has smoke detectors, has carbon monoxide detectors and wears sunscreen with extended sun exposure. Dental Care: biannual cleanings, brushes and flosses daily. Ophthalmology/Optometry: Annual visit.  Hearing loss: none Vision impairments: none  Menarche: unknown Menstrual History: regular LMP: Postmenopause Pregnancy history: Never Safe at home: yes Self breast exams: NO  DEXA-01/02/2020  Mammogram 03/23/2021  [place] Visit from 07/20/2021 in [facility] [place]  PHQ-2 Total Score 0       Current Exercise Habits: The patient does not participate in regular exercise at present (patient refuses to exercise) Exercise limited by: neurologic condition(s)     Social Hx   Social History   Socioeconomic History   Marital status: Single    Spouse name: Not on file   Number of children: Not on file   Years of education: Not on file   Highest education level: Not on file  Occupational History   Not on file  Tobacco Use   Smoking status: Never   Smokeless tobacco: Never  Substance and Sexual Activity   Alcohol use: Never   Drug use: Never    Sexual activity: Not Currently  Other Topics Concern   Not on file  Social History Narrative   Lives at [place] Determinants of Health   Financial Resource Strain: Low Risk    Difficulty of Paying Living Expenses: Not hard at all  Food Insecurity: No Food Insecurity   Worried About Charity fundraiser in the Last Year: Never true   [place] in the Last Year: Never true  Transportation Needs: No Transportation Needs   Lack of Transportation (Medical): No   Lack of Transportation (Non-Medical): No  Physical Activity: Not on file  Stress: No Stress Concern Present   Feeling of Stress : Not at all  Social Connections: Not on file   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    Atrophy of thyroid    Coronary artery disease 2003   cabg   Depression    Hyperlipidemia    Hypertension    Hypertensive heart disease without heart failure    Moderate intellectual disabilities    Osteoporosis    Other nonrheumatic aortic valve disorders    Other specified disorders of bone density and structure, multiple sites    Presence of aortocoronary bypass graft    Presence of prosthetic heart valve    Past Surgical History:  Procedure Laterality Date   ABDOMINAL HYSTERECTOMY     BL oophorectomy  ANKLE SURGERY Left    CARDIAC VALVE REPLACEMENT     2003 (aortic valve and mitral valve replacements.)   CORONARY ARTERY BYPASS GRAFT      Family History  Problem Relation Age of Onset   Breast cancer Other     Review of Systems  Constitutional:  Negative for chills, fatigue and fever.  HENT:  Negative for congestion, ear pain, postnasal drip, rhinorrhea, sinus pressure, sinus pain and sore throat.   Respiratory:  Positive for shortness of breath (has DOE.). Negative for cough.   Cardiovascular:  Negative for chest pain and leg swelling.   Gastrointestinal:  Negative for abdominal pain, constipation, diarrhea, nausea and vomiting.  Genitourinary:  Positive for frequency. Negative for dysuria.  Neurological:  Negative for dizziness and headaches.    Objective:  BP 110/80   Pulse 90   Temp (!) 97.1 F (36.2 C)   Resp 14   Ht 5' 4" (1.626 m)   Wt 164 lb (74.4 kg)   SpO2 98%   BMI 28.15 kg/m   BP/Weight 07/20/2021 02/24/2021 9/62/8366  Systolic BP 294 765 465  Diastolic BP 80 74 68  Wt. (Lbs) 164 174 177  BMI 28.15 30.07 28.57    Physical Exam Vitals reviewed.  Constitutional:      Appearance: Normal appearance.  Neck:     Vascular: No carotid bruit.  Cardiovascular:     Rate and Rhythm: Normal rate and regular rhythm.     Pulses: Normal pulses.     Heart sounds: Normal heart sounds.  Pulmonary:     Effort: Pulmonary effort is normal. No respiratory distress.     Breath sounds: Normal breath sounds.  Abdominal:     General: Abdomen is flat. Bowel sounds are normal.     Palpations: Abdomen is soft.     Tenderness: There is no abdominal tenderness.  Neurological:     Mental Status: She is alert.     Comments: Mentally disabled.   Psychiatric:        Mood and Affect: Mood normal.        Behavior: Behavior normal.    Lab Results  Component Value Date   WBC 6.6 02/24/2021   HGB 13.4 02/24/2021   HCT 40.1 02/24/2021   PLT 141 (L) 02/24/2021   GLUCOSE 124 (H) 02/24/2021   CHOL 250 (H) 02/25/2021   TRIG 127 02/25/2021   HDL 52 02/25/2021   LDLCALC 175 (H) 02/25/2021   ALT 51 (H) 02/24/2021   AST 49 (H) 02/24/2021   NA 134 02/24/2021   K 4.5 02/24/2021   CL 94 (L) 02/24/2021   CREATININE 0.84 02/24/2021   BUN 14 02/24/2021   CO2 23 02/24/2021   TSH 3.270 02/24/2021   HGBA1C 6.1 (H) 03/21/2020      Assessment & Plan:  1. Encounter for well adult exam with abnormal findings Disabled adult living in a group home. Pt is in a save home. Pt seems happy.  Continue to work on eating healthy and  exercising.   2. Need for immunization against influenza - Flu Vaccine QUAD High Dose(Fluad)   Body mass index is 28.15 kg/m.    This is a list of the screening recommended for you and due dates:  Health Maintenance  Topic Date Due   Hepatitis C Screening: USPSTF Recommendation to screen - Ages 15-79 yo.  Never done   Zoster (Shingles) Vaccine (1 of 2) Never done   COVID-19 Vaccine (4 - Booster for Moderna series)  02/03/2021   Mammogram  04/03/2023   Tetanus Vaccine  01/09/2026   Flu Shot  Completed   DEXA scan (bone density measurement)  Completed   HPV Vaccine  Aged Out   Colon Cancer Screening  Discontinued     AN INDIVIDUALIZED CARE PLAN: was established or reinforced today.   SELF MANAGEMENT: The patient's caretaker and I together assessed ways to personally work towards obtaining the recommended goals  Support needs The patient's needs were assessed and services were offered if appropriate.  Meds ordered this encounter  Medications   furosemide (LASIX) 20 MG tablet    Sig: Take 1 tablet (20 mg total) by mouth daily.    Dispense:  90 tablet    Refill:  1    Follow-up: Return in about 1 year (around 07/20/2022) for AWV.  An After Visit Summary was printed and given to the patient.  Rochel Brome, MD [facility] [phone]

## 2021-07-20 NOTE — Patient Instructions (Signed)
Written order for furosemide and copy of flu vaccination given to Caregiver for patients record.

## 2021-07-27 ENCOUNTER — Ambulatory Visit: Payer: Medicare Other

## 2021-08-05 ENCOUNTER — Other Ambulatory Visit: Payer: Self-pay | Admitting: Family Medicine

## 2021-08-05 DIAGNOSIS — E039 Hypothyroidism, unspecified: Secondary | ICD-10-CM

## 2021-08-05 DIAGNOSIS — R1013 Epigastric pain: Secondary | ICD-10-CM

## 2021-08-05 DIAGNOSIS — E038 Other specified hypothyroidism: Secondary | ICD-10-CM

## 2021-08-05 DIAGNOSIS — R0789 Other chest pain: Secondary | ICD-10-CM

## 2021-08-05 DIAGNOSIS — I483 Typical atrial flutter: Secondary | ICD-10-CM

## 2021-08-06 ENCOUNTER — Other Ambulatory Visit: Payer: Self-pay

## 2021-08-06 DIAGNOSIS — R1013 Epigastric pain: Secondary | ICD-10-CM

## 2021-08-06 DIAGNOSIS — E039 Hypothyroidism, unspecified: Secondary | ICD-10-CM

## 2021-08-06 DIAGNOSIS — E038 Other specified hypothyroidism: Secondary | ICD-10-CM

## 2021-08-06 DIAGNOSIS — I483 Typical atrial flutter: Secondary | ICD-10-CM

## 2021-08-06 DIAGNOSIS — R0789 Other chest pain: Secondary | ICD-10-CM

## 2021-08-06 MED ORDER — LOSARTAN POTASSIUM 50 MG PO TABS: 50.0000 mg | ORAL_TABLET | Freq: Every day | ORAL | 11 refills | Status: DC

## 2021-08-06 MED ORDER — EZETIMIBE 10 MG PO TABS
10.0000 mg | ORAL_TABLET | Freq: Every day | ORAL | 11 refills | Status: DC
Start: 2021-08-06 — End: 2022-03-02

## 2021-08-06 MED ORDER — LEVOTHYROXINE SODIUM 50 MCG PO TABS: 50.0000 ug | ORAL_TABLET | Freq: Every day | ORAL | 11 refills | Status: DC

## 2021-08-06 MED ORDER — OMEPRAZOLE 40 MG PO CPDR
DELAYED_RELEASE_CAPSULE | ORAL | 11 refills | Status: DC
Start: 2021-08-06 — End: 2022-03-02

## 2021-08-06 MED ORDER — METOPROLOL SUCCINATE ER 25 MG PO TB24: ORAL_TABLET | ORAL | 11 refills | Status: DC

## 2021-08-06 MED ORDER — FLUOXETINE HCL 20 MG PO CAPS
ORAL_CAPSULE | ORAL | 11 refills | Status: DC
Start: 2021-08-06 — End: 2022-03-02

## 2021-08-06 MED ORDER — HYDROCHLOROTHIAZIDE 12.5 MG PO TABS: 12.5000 mg | ORAL_TABLET | Freq: Every day | ORAL | 11 refills | Status: DC

## 2021-08-06 MED ORDER — RANOLAZINE ER 500 MG PO TB12: ORAL_TABLET | ORAL | 11 refills | Status: DC

## 2021-08-10 ENCOUNTER — Ambulatory Visit: Payer: Medicare Other

## 2021-08-11 ENCOUNTER — Ambulatory Visit: Payer: Medicare Other

## 2021-08-11 ENCOUNTER — Other Ambulatory Visit: Payer: Self-pay

## 2021-08-11 DIAGNOSIS — H6123 Impacted cerumen, bilateral: Secondary | ICD-10-CM

## 2021-08-11 NOTE — Progress Notes (Signed)
Patient and caregiver in office for bilateral ear irrigation. Both impacted. Both ears successful but still impacted. Patient and caregiver advised to continue use of debrox drops for one week and return for nurse visit to attempt again. Caregiver VU.   Alexandra Barajas, [place] 08/11/21 9:24 AM

## 2021-08-15 ENCOUNTER — Encounter: Payer: Self-pay | Admitting: Family Medicine

## 2021-08-18 ENCOUNTER — Other Ambulatory Visit: Payer: Self-pay

## 2021-08-18 ENCOUNTER — Ambulatory Visit: Payer: Medicare Other

## 2021-08-25 NOTE — Progress Notes (Signed)
Subjective:  Patient ID: Alexandra Barajas, female    DOB: 1951-03-06  Age: 70 y.o. MRN: [id number]  Chief Complaint  Patient presents with   Hypothyroidism   Hypertension   Hyperlipidemia    HPI Alexandra Barajas is a 70 year old Caucasian female that presents for fasting appointment. She is a resident of [facility] and is accompanied by staff member who assists with medical history questions.  For her hypertensive heart disease and elevated cholesterol she is on zetia 10 mg once daily,ranexa bid, aspirin. Losartan, metoprolol, hctz, and repatha. Patient takes omeprazole for GERD Patient has osteoporosis. Taking calcium with D.  and is taking alendronate.   Current Outpatient Medications on File Prior to Visit  Medication Sig Dispense Refill   Calcium Carbonate-Vitamin D 600-400 MG-UNIT tablet Take 1 tablet by mouth in the morning and at bedtime. 60 tablet 11   chlorhexidine (PERIDEX) 0.12 % solution      Docusate Sodium (DSS) 100 MG CAPS Take 1 tablet by mouth daily. 30 capsule 11   Evolocumab (REPATHA SURECLICK) 086 MG/ML SOAJ Inject into the skin.     ezetimibe (ZETIA) 10 MG tablet Take 1 tablet (10 mg total) by mouth daily. 30 tablet 11   FLUoxetine (PROZAC) 20 MG capsule TAKE 4 CAPSULES (80 MG) BY MOUTH EVERY MORNING 120 capsule 11   furosemide (LASIX) 20 MG tablet Take 1 tablet (20 mg total) by mouth daily. 90 tablet 1   hydrochlorothiazide (HYDRODIURIL) 12.5 MG tablet Take 1 tablet (12.5 mg total) by mouth daily. 30 tablet 11   lactulose (CHRONULAC) 10 GM/15ML solution TAKE 15 ML BY MOUTH ONCE DAILY 473 mL 10   levothyroxine (SYNTHROID) 50 MCG tablet Take 1 tablet (50 mcg total) by mouth daily. 30 tablet 11   losartan (COZAAR) 50 MG tablet Take 1 tablet (50 mg total) by mouth daily. 30 tablet 11   metoprolol succinate (TOPROL-XL) 25 MG 24 hr tablet TAKE 1 TABLET BY MOUTH ONCE DAILY **DO NOT CRUSH* 30 tablet 11   omeprazole (PRILOSEC) 40 MG capsule TAKE ONE CAPSULE BY MOUTH EVERY DAY  **DO NOT CRUSH* 30 capsule 11   Prenatal Vit-Fe Fumarate-FA (MULTIVITAMIN-PRENATAL) 27-0.8 MG TABS tablet Take 1 tablet by mouth daily at 12 noon. 30 tablet 11   ranolazine (RANEXA) 500 MG 12 hr tablet TAKE ONE TABLET BY MOUTH TWICE DAILY **DO NOT CRUSH* 60 tablet 11   Wheat Dextrin (BENEFIBER) POWD Stir 2 teaspoons of Benefiber into 4-8 oz of your favorite non-carbonated beverage or soft food (hot or cold). Stir well until dissolved. 2 teaspoons three times daily. Do not exceed 6 teaspoons per day. 730 g 11   No current facility-administered medications on file prior to visit.   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    Atrophy of thyroid    Coronary artery disease 2003   cabg   Depression    Hyperlipidemia    Hypertension    Hypertensive heart disease without heart failure    Moderate intellectual disabilities    Osteoporosis    Other nonrheumatic aortic valve disorders    Other specified disorders of bone density and structure, multiple sites    Presence of aortocoronary bypass graft    Presence of prosthetic heart valve    Past Surgical History:  Procedure Laterality Date   ABDOMINAL HYSTERECTOMY     BL oophorectomy  ANKLE SURGERY Left    CARDIAC VALVE REPLACEMENT     2003 (aortic valve and mitral valve replacements.)   CORONARY ARTERY BYPASS GRAFT      Family History  Problem Relation Age of Onset   Breast cancer Other    Social History   Socioeconomic History   Marital status: Single    Spouse name: Not on file   Number of children: Not on file   Years of education: Not on file   Highest education level: Not on file  Occupational History   Not on file  Tobacco Use   Smoking status: Never   Smokeless tobacco: Never  Substance and Sexual Activity   Alcohol use: Never   Drug use: Never   Sexual activity: Not Currently   Other Topics Concern   Not on file  Social History Narrative   Lives at [facility]   Financial Resource Strain: Low Risk    Difficulty of Paying Living Expenses: Not hard at all  Food Insecurity: No Food Insecurity   Worried About Charity fundraiser in the Last Year: Never true   Ran Out of Food in the Last Year: Never true  Transportation Needs: No Transportation Needs   Lack of Transportation (Medical): No   Lack of Transportation (Non-Medical): No  Physical Activity: Not on file  Stress: No Stress Concern Present   Feeling of Stress : Not at all  Social Connections: Not on file    Review of Systems  Constitutional:  Negative for fatigue.  HENT:  Negative for congestion, ear pain and sore throat.   Respiratory:  Negative for cough and shortness of breath.   Cardiovascular:  Negative for chest pain.  Gastrointestinal:  Negative for abdominal pain, constipation, diarrhea, nausea and vomiting.  Genitourinary:  Negative for dysuria, frequency and urgency.  Musculoskeletal:  Negative for arthralgias, back pain and myalgias.  Neurological:  Negative for dizziness and headaches.  Psychiatric/Behavioral:  Negative for agitation and sleep disturbance. The patient is not nervous/anxious.     Objective:  BP 112/78 (BP Location: Left Arm, Patient Position: Sitting, Cuff Size: Normal)   Pulse 91   Temp 97.9 F (36.6 C) (Temporal)   Resp 20   Ht 5' 4" (1.626 m)   Wt 164 lb 3.2 oz (74.5 kg)   SpO2 95%   BMI 28.18 kg/m   BP/Weight 08/26/2021 07/20/2021 3/55/7322  Systolic BP 025 427 062  Diastolic BP 78 80 74  Wt. (Lbs) 164.2 164 174  BMI 28.18 28.15 30.07    Physical Exam Vitals reviewed.  Constitutional:      Appearance: Normal appearance.  Neck:     Vascular: No carotid bruit.  Cardiovascular:     Rate and Rhythm: Normal rate and regular rhythm.     Heart sounds: Normal heart sounds.  Pulmonary:     Effort: Pulmonary effort is  normal.     Breath sounds: Normal breath sounds.  Abdominal:     General: Bowel sounds are normal.     Palpations: Abdomen is soft.     Tenderness: There is no abdominal tenderness.  Neurological:     Mental Status: She is alert.  Psychiatric:        Mood and Affect: Mood normal.        Behavior: Behavior normal.    Diabetic Foot Exam - Simple   No data filed      Lab Results  Component Value Date  WBC 5.6 08/26/2021   HGB 14.9 08/26/2021   HCT 45.1 08/26/2021   PLT 132 (L) 08/26/2021   GLUCOSE 101 (H) 08/26/2021   CHOL 173 08/26/2021   TRIG 105 08/26/2021   HDL 62 08/26/2021   LDLCALC 92 08/26/2021   ALT 71 (H) 08/26/2021   AST 65 (H) 08/26/2021   NA 137 08/26/2021   K 4.5 08/26/2021   CL 97 08/26/2021   CREATININE 0.83 08/26/2021   BUN 13 08/26/2021   CO2 25 08/26/2021   TSH 2.020 08/26/2021   HGBA1C 5.7 (H) 08/26/2021      Assessment & Plan:   Problem List Items Addressed This Visit       Cardiovascular and Mediastinum   Coronary artery disease involving native coronary artery of native heart without angina pectoris - Primary     toprol xl 25 mg once daily, ranexa 500 mg one bid, and repatha and zetia.      Relevant Orders   Comprehensive metabolic panel (Completed)   Typical atrial flutter (HCC)    Continue toprol xl 25 mg once daily      Hypertension with heart disease    Well controlled.  Continue hctz 12.5 mg daily, losartan 50 mg once daily, toprol xl 25 mg once daily, ranexa 500 mg one bid.  Continue to work on eating a healthy diet and exercise.  Labs drawn today.        Relevant Orders   CBC With Diff/Platelet (Completed)     Endocrine   Acquired hypothyroidism    The current medical regimen is effective;  continue present plan and medications. Continue synthroid 50 mcg once daily      Relevant Orders   TSH (Completed)     Musculoskeletal and Integument   Hyperlipidemia type III    Low fat diet and exercise.  Continue zetia  and repatha.      Relevant Orders   Lipid panel (Completed)   Age-related osteoporosis without current pathological fracture    Continue calcium carbonate with D.         Other   Major depressive disorder, recurrent episode, mild (HCC)    Continue prozac 20 mg 4 daily.      Class 1 obesity due to excess calories with serious comorbidity and body mass index (BMI) of 31.0 to 31.9 in adult    Recommend continue to work on eating healthy diet and exercise.       Myalgia due to HMG CoA reductase inhibitor    Intolerant to statins      Mental disability    Stays in adult group home.       Prediabetes    Low sugar diet and exercise.       Relevant Orders   Hemoglobin A1c (Completed)  .  No orders of the defined types were placed in this encounter.   Orders Placed This Encounter  Procedures   CBC With Diff/Platelet   Comprehensive metabolic panel   Hemoglobin A1c   Lipid panel   TSH   Cardiovascular Risk Assessment      Follow-up: Return in about 6 months (around 02/24/2022) for chronic fasting.  An After Visit Summary was printed and given to the patient.  Rochel Brome, MD [facility] [phone]

## 2021-08-26 ENCOUNTER — Other Ambulatory Visit: Payer: Self-pay

## 2021-08-26 ENCOUNTER — Ambulatory Visit (INDEPENDENT_AMBULATORY_CARE_PROVIDER_SITE_OTHER): Payer: Medicare Other | Admitting: Family Medicine

## 2021-08-26 ENCOUNTER — Encounter: Payer: Self-pay | Admitting: Family Medicine

## 2021-08-26 VITALS — BP 112/78 | HR 91 | Temp 97.9°F | Resp 20 | Ht 64.0 in | Wt 164.2 lb

## 2021-08-26 DIAGNOSIS — E782 Mixed hyperlipidemia: Secondary | ICD-10-CM

## 2021-08-26 DIAGNOSIS — I2510 Atherosclerotic heart disease of native coronary artery without angina pectoris: Secondary | ICD-10-CM

## 2021-08-26 DIAGNOSIS — M791 Myalgia, unspecified site: Secondary | ICD-10-CM

## 2021-08-26 DIAGNOSIS — E039 Hypothyroidism, unspecified: Secondary | ICD-10-CM | POA: Diagnosis not present

## 2021-08-26 DIAGNOSIS — F330 Major depressive disorder, recurrent, mild: Secondary | ICD-10-CM | POA: Diagnosis not present

## 2021-08-26 DIAGNOSIS — I119 Hypertensive heart disease without heart failure: Secondary | ICD-10-CM

## 2021-08-26 DIAGNOSIS — R7303 Prediabetes: Secondary | ICD-10-CM

## 2021-08-26 DIAGNOSIS — E6609 Other obesity due to excess calories: Secondary | ICD-10-CM

## 2021-08-26 DIAGNOSIS — I483 Typical atrial flutter: Secondary | ICD-10-CM

## 2021-08-26 DIAGNOSIS — F79 Unspecified intellectual disabilities: Secondary | ICD-10-CM

## 2021-08-26 DIAGNOSIS — M810 Age-related osteoporosis without current pathological fracture: Secondary | ICD-10-CM

## 2021-08-26 DIAGNOSIS — Z6831 Body mass index (BMI) 31.0-31.9, adult: Secondary | ICD-10-CM

## 2021-08-26 DIAGNOSIS — T466X5A Adverse effect of antihyperlipidemic and antiarteriosclerotic drugs, initial encounter: Secondary | ICD-10-CM

## 2021-08-27 ENCOUNTER — Ambulatory Visit: Payer: Medicare Other

## 2021-08-27 LAB — LIPID PANEL
Chol/HDL Ratio: 2.8 ratio (ref 0.0–4.4)
Cholesterol, Total: 173 mg/dL (ref 100–199)
HDL: 62 mg/dL (ref >39)
LDL Chol Calc (NIH): 92 mg/dL (ref 0–99)
Triglycerides: 105 mg/dL (ref 0–149)
VLDL Cholesterol Cal: 19 mg/dL (ref 5–40)

## 2021-08-27 LAB — CBC WITH DIFF/PLATELET
Basophils Absolute: 0.1 10*3/uL (ref 0.0–0.2)
Basos: 1 %
EOS (ABSOLUTE): 0.2 10*3/uL (ref 0.0–0.4)
Eos: 3 %
Hematocrit: 45.1 % (ref 34.0–46.6)
Hemoglobin: 14.9 g/dL (ref 11.1–15.9)
Immature Grans (Abs): 0 10*3/uL (ref 0.0–0.1)
Immature Granulocytes: 0 %
Lymphocytes Absolute: 1.9 10*3/uL (ref 0.7–3.1)
Lymphs: 34 %
MCH: 31.2 pg (ref 26.6–33.0)
MCHC: 33 g/dL (ref 31.5–35.7)
MCV: 95 fL (ref 79–97)
Monocytes Absolute: 0.6 10*3/uL (ref 0.1–0.9)
Monocytes: 11 %
Neutrophils Absolute: 2.8 10*3/uL (ref 1.4–7.0)
Neutrophils: 51 %
Platelets: 132 10*3/uL — ABNORMAL LOW (ref 150–450)
RBC: 4.77 x10E6/uL (ref 3.77–5.28)
RDW: 12.8 % (ref 11.7–15.4)
WBC: 5.6 10*3/uL (ref 3.4–10.8)

## 2021-08-27 LAB — COMPREHENSIVE METABOLIC PANEL
ALT: 71 IU/L — ABNORMAL HIGH (ref 0–32)
AST: 65 IU/L — ABNORMAL HIGH (ref 0–40)
Albumin/Globulin Ratio: 1.4 (ref 1.2–2.2)
Albumin: 4.1 g/dL (ref 3.7–4.7)
Alkaline Phosphatase: 95 IU/L (ref 44–121)
BUN/Creatinine Ratio: 16 (ref 12–28)
BUN: 13 mg/dL (ref 8–27)
Bilirubin Total: 0.4 mg/dL (ref 0.0–1.2)
CO2: 25 mmol/L (ref 20–29)
Calcium: 9.9 mg/dL (ref 8.7–10.3)
Chloride: 97 mmol/L (ref 96–106)
Creatinine, Ser: 0.83 mg/dL (ref 0.57–1.00)
Globulin, Total: 3 g/dL (ref 1.5–4.5)
Glucose: 101 mg/dL — ABNORMAL HIGH (ref 70–99)
Potassium: 4.5 mmol/L (ref 3.5–5.2)
Sodium: 137 mmol/L (ref 134–144)
Total Protein: 7.1 g/dL (ref 6.0–8.5)
eGFR: 75 mL/min/{1.73_m2} (ref >59)

## 2021-08-27 LAB — HEMOGLOBIN A1C
Est. average glucose Bld gHb Est-mCnc: 117 mg/dL
Hgb A1c MFr Bld: 5.7 % — ABNORMAL HIGH (ref 4.8–5.6)

## 2021-08-27 LAB — TSH: TSH: 2.02 u[IU]/mL (ref 0.450–4.500)

## 2021-08-31 NOTE — Assessment & Plan Note (Signed)
Continue toprol xl 25 mg once daily

## 2021-08-31 NOTE — Assessment & Plan Note (Signed)
Recommend continue to work on eating healthy diet and exercise.

## 2021-08-31 NOTE — Assessment & Plan Note (Signed)
Well controlled.  Continue hctz 12.5 mg daily, losartan 50 mg once daily, toprol xl 25 mg once daily, ranexa 500 mg one bid.  Continue to work on eating a healthy diet and exercise.  Labs drawn today.

## 2021-08-31 NOTE — Assessment & Plan Note (Signed)
Stays in adult group home.

## 2021-08-31 NOTE — Assessment & Plan Note (Signed)
Continue prozac 20 mg 4 daily.

## 2021-08-31 NOTE — Assessment & Plan Note (Signed)
toprol xl 25 mg once daily, ranexa 500 mg one bid, and repatha and zetia.

## 2021-08-31 NOTE — Assessment & Plan Note (Signed)
Continue calcium carbonate with D.

## 2021-08-31 NOTE — Assessment & Plan Note (Signed)
Intolerant to statins

## 2021-08-31 NOTE — Assessment & Plan Note (Signed)
The current medical regimen is effective;  continue present plan and medications. Continue synthroid 50 mcg once daily

## 2021-08-31 NOTE — Assessment & Plan Note (Signed)
Low fat diet and exercise.  Continue zetia and repatha.

## 2021-08-31 NOTE — Assessment & Plan Note (Signed)
Low sugar diet and exercise.

## 2021-09-01 ENCOUNTER — Other Ambulatory Visit: Payer: Self-pay

## 2021-09-01 ENCOUNTER — Ambulatory Visit (INDEPENDENT_AMBULATORY_CARE_PROVIDER_SITE_OTHER): Payer: Medicare Other

## 2021-09-01 DIAGNOSIS — Z23 Encounter for immunization: Secondary | ICD-10-CM | POA: Diagnosis not present

## 2021-09-13 ENCOUNTER — Other Ambulatory Visit: Payer: Self-pay | Admitting: Family Medicine

## 2021-09-21 ENCOUNTER — Encounter: Payer: Self-pay | Admitting: Nurse Practitioner

## 2021-09-21 ENCOUNTER — Ambulatory Visit (INDEPENDENT_AMBULATORY_CARE_PROVIDER_SITE_OTHER): Payer: Medicare Other | Admitting: Nurse Practitioner

## 2021-09-21 ENCOUNTER — Other Ambulatory Visit: Payer: Self-pay

## 2021-09-21 VITALS — BP 126/74 | HR 93 | Temp 97.0°F | Ht 64.0 in | Wt 161.0 lb

## 2021-09-21 DIAGNOSIS — Y92009 Unspecified place in unspecified non-institutional (private) residence as the place of occurrence of the external cause: Secondary | ICD-10-CM

## 2021-09-21 DIAGNOSIS — R54 Age-related physical debility: Secondary | ICD-10-CM | POA: Diagnosis not present

## 2021-09-21 DIAGNOSIS — W19XXXD Unspecified fall, subsequent encounter: Secondary | ICD-10-CM | POA: Diagnosis not present

## 2021-09-21 DIAGNOSIS — F79 Unspecified intellectual disabilities: Secondary | ICD-10-CM | POA: Diagnosis not present

## 2021-09-21 NOTE — Progress Notes (Addendum)
Subjective:  Patient ID: Alexandra Barajas, female    DOB: [birth year]  Age: 70 y.o. MRN: [id number]  Chief Complaint  Patient presents with   Hospitalization Follow-up    HPI  Alexandra Barajas is a 70 year old Caucasian female that presents for hospital-follow-up s/p fall. She is a resident at a group home due to a mental disabiltiy. She is accompanied by a group home staff member.She was evaluated at [facility]. Caregiver denies LOC. States she did strike left occipital area of head during the fall. She did not have a laceration. Head CT negative. Denies headache, nausea, vomiting, or AMS.    Follow up ER visit  Patient was seen in ER for fall  on 09/13/21. She was treated for fall. Treatment for this included CT of head. She reports excellent compliance with treatment. She reports this condition is Improved.  Current Outpatient Medications on File Prior to Visit  Medication Sig Dispense Refill   Calcium Carbonate-Vitamin D 600-400 MG-UNIT tablet Take 1 tablet by mouth in the morning and at bedtime. 60 tablet 11   chlorhexidine (PERIDEX) 0.12 % solution      Docusate Sodium (DSS) 100 MG CAPS Take 1 tablet by mouth daily. 30 capsule 11   Evolocumab (REPATHA SURECLICK) 703 MG/ML SOAJ Inject into the skin.     ezetimibe (ZETIA) 10 MG tablet Take 1 tablet (10 mg total) by mouth daily. 30 tablet 11   FLUoxetine (PROZAC) 20 MG capsule TAKE 4 CAPSULES (80 MG) BY MOUTH EVERY MORNING 120 capsule 11   furosemide (LASIX) 20 MG tablet Take 1 tablet (20 mg total) by mouth daily. 90 tablet 1   hydrochlorothiazide (HYDRODIURIL) 12.5 MG tablet Take 1 tablet (12.5 mg total) by mouth daily. 30 tablet 11   lactulose (CHRONULAC) 10 GM/15ML solution TAKE 15 ML BY MOUTH ONCE DAILY 473 mL 11   levothyroxine (SYNTHROID) 50 MCG tablet Take 1 tablet (50 mcg total) by mouth daily. 30 tablet 11   losartan (COZAAR) 50 MG tablet Take 1 tablet (50 mg total) by mouth daily. 30 tablet 11   metoprolol succinate  (TOPROL-XL) 25 MG 24 hr tablet TAKE 1 TABLET BY MOUTH ONCE DAILY **DO NOT CRUSH* 30 tablet 11   omeprazole (PRILOSEC) 40 MG capsule TAKE ONE CAPSULE BY MOUTH EVERY DAY **DO NOT CRUSH* 30 capsule 11   Prenatal Vit-Fe Fumarate-FA (MULTIVITAMIN-PRENATAL) 27-0.8 MG TABS tablet Take 1 tablet by mouth daily at 12 noon. 30 tablet 11   ranolazine (RANEXA) 500 MG 12 hr tablet TAKE ONE TABLET BY MOUTH TWICE DAILY **DO NOT CRUSH* 60 tablet 11   Wheat Dextrin (BENEFIBER) POWD Stir 2 teaspoons of Benefiber into 4-8 oz of your favorite non-carbonated beverage or soft food (hot or cold). Stir well until dissolved. 2 teaspoons three times daily. Do not exceed 6 teaspoons per day. 730 g 11   No current facility-administered medications on file prior to visit.   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    Atrophy of thyroid    Coronary artery disease 2003   cabg   Depression    Hyperlipidemia    Hypertension    Hypertensive heart disease without heart failure    Moderate intellectual disabilities    Osteoporosis    Other nonrheumatic aortic valve disorders    Other specified disorders of bone density and structure, multiple sites  Presence of aortocoronary bypass graft    Presence of prosthetic heart valve    Past Surgical History:  Procedure Laterality Date   ABDOMINAL HYSTERECTOMY     BL oophorectomy   ANKLE SURGERY Left    CARDIAC VALVE REPLACEMENT     2003 (aortic valve and mitral valve replacements.)   CORONARY ARTERY BYPASS GRAFT      Family History  Problem Relation Age of Onset   Breast cancer Other    Social History   Socioeconomic History   Marital status: Single    Spouse name: Not on file   Number of children: Not on file   Years of education: Not on file   Highest education level: Not on file  Occupational History   Not on file  Tobacco  Use   Smoking status: Never   Smokeless tobacco: Never  Substance and Sexual Activity   Alcohol use: Never   Drug use: Never   Sexual activity: Not Currently  Other Topics Concern   Not on file  Social History Narrative   Lives at [place] [facility]ants of [facility]   Financial Resource Strain: Low Risk    Difficulty of Paying Living Expenses: Not hard at all  Food Insecurity: No Food Insecurity   Worried About Charity fundraiser in the Last Year: Never true   [place] in the Last Year: Never true  Transportation Needs: No Transportation Needs   Lack of Transportation (Medical): No   Lack of Transportation (Non-Medical): No  Physical Activity: Not on file  Stress: No Stress Concern Present   Feeling of Stress : Not at all  Social Connections: Not on file    Review of Systems  Constitutional:  Negative for chills, fatigue and fever.  HENT:  Positive for congestion. Negative for ear pain, rhinorrhea and sore throat.   Respiratory:  Negative for cough and shortness of breath.   Cardiovascular:  Negative for chest pain.    Objective:  BP 126/74   Pulse 93   Temp (!) 97 F (36.1 C)   Ht 5' 4" (1.626 m)   Wt 161 lb (73 kg)   SpO2 98%   BMI 27.64 kg/m   BP/Weight 09/21/2021 08/26/2021 1/61/0960  Systolic BP 454 098 119  Diastolic BP 74 78 80  Wt. (Lbs) 161 164.2 164  BMI 27.64 28.18 28.15    Physical Exam Vitals reviewed.  Constitutional:      Appearance: Normal appearance.  HENT:     Head: Normocephalic.     Right Ear: Tympanic membrane normal.     Left Ear: Tympanic membrane normal.     Nose: Nose normal.     Mouth/Throat:     Mouth: Mucous membranes are moist.  Eyes:     Pupils: Pupils are equal, round, and reactive to light.  Cardiovascular:     Rate and Rhythm: Normal rate and regular rhythm.     Pulses: Normal pulses.     Heart sounds: Normal heart sounds.  Pulmonary:     Effort: Pulmonary effort is normal.     Breath sounds:  Normal breath sounds.  Abdominal:     General: Bowel sounds are normal.     Palpations: Abdomen is soft.  Musculoskeletal:        General: Normal range of motion.  Skin:    General: Skin is warm and dry.     Capillary Refill: Capillary refill takes less than 2 seconds.  Neurological:  Mental Status: She is alert. Mental status is at baseline.  Psychiatric:        Mood and Affect: Mood normal.        Behavior: Behavior normal.       Lab Results  Component Value Date   WBC 5.6 08/26/2021   HGB 14.9 08/26/2021   HCT 45.1 08/26/2021   PLT 132 (L) 08/26/2021   GLUCOSE 101 (H) 08/26/2021   CHOL 173 08/26/2021   TRIG 105 08/26/2021   HDL 62 08/26/2021   LDLCALC 92 08/26/2021   ALT 71 (H) 08/26/2021   AST 65 (H) 08/26/2021   NA 137 08/26/2021   K 4.5 08/26/2021   CL 97 08/26/2021   CREATININE 0.83 08/26/2021   BUN 13 08/26/2021   CO2 25 08/26/2021   TSH 2.020 08/26/2021   HGBA1C 5.7 (H) 08/26/2021      Assessment & Plan:   1. Age-related physical debility -fall precautions -change positions slowly -assist with ADLs as needed   2. Fall in home, subsequent encounter -fall precautions  3. Mental disability  -assist with ADLs as needed   Fall precautions  Clear pathway around bed and hallway Use light at night when you get out bed or walking around home Change positions slowly       Follow-up: PRN  An After Visit Summary was printed and given to the patient.  I, Rip Harbour, NP, have reviewed all documentation for this visit. The documentation on 09/21/21 for the exam, diagnosis, procedures, and orders are all accurate and complete.    Signed, Rip Harbour, NP [place] [phone]

## 2021-09-21 NOTE — Patient Instructions (Signed)
Fall precautions  Clear pathway around bed and hallway Use light at night when you get out bed or walking around home Change positions slowly   Fall Prevention in the Home, Adult Falls can cause injuries and can happen to people of all ages. There are many things you can do to make your home safe and to help prevent falls. Ask for help when making these changes. What actions can I take to prevent falls? General Instructions Use good lighting in all rooms. Replace any light bulbs that burn out. Turn on the lights in dark areas. Use night-lights. Keep items that you use often in easy-to-reach places. Lower the shelves around your home if needed. Set up your furniture so you have a clear path. Avoid moving your furniture around. Do not have throw rugs or other things on the floor that can make you trip. Avoid walking on wet floors. If any of your floors are uneven, fix them. Add color or contrast paint or tape to clearly mark and help you see: Grab bars or handrails. First and last steps of staircases. Where the edge of each step is. If you use a stepladder: Make sure that it is fully opened. Do not climb a closed stepladder. Make sure the sides of the stepladder are locked in place. Ask someone to hold the stepladder while you use it. Know where your pets are when moving through your home. What can I do in the bathroom?   Keep the floor dry. Clean up any water on the floor right away. Remove soap buildup in the tub or shower. Use nonskid mats or decals on the floor of the tub or shower. Attach bath mats securely with double-sided, nonslip rug tape. If you need to sit down in the shower, use a plastic, nonslip stool. Install grab bars by the toilet and in the tub and shower. Do not use towel bars as grab bars. What can I do in the bedroom? Make sure that you have a light by your bed that is easy to reach. Do not use any sheets or blankets for your bed that hang to the floor. Have a  firm chair with side arms that you can use for support when you get dressed. What can I do in the kitchen? Clean up any spills right away. If you need to reach something above you, use a step stool with a grab bar. Keep electrical cords out of the way. Do not use floor polish or wax that makes floors slippery. What can I do with my stairs? Do not leave any items on the stairs. Make sure that you have a light switch at the top and the bottom of the stairs. Make sure that there are handrails on both sides of the stairs. Fix handrails that are broken or loose. Install nonslip stair treads on all your stairs. Avoid having throw rugs at the top or bottom of the stairs. Choose a carpet that does not hide the edge of the steps on the stairs. Check carpeting to make sure that it is firmly attached to the stairs. Fix carpet that is loose or worn. What can I do on the outside of my home? Use bright outdoor lighting. Fix the edges of walkways and driveways and fix any cracks. Remove anything that might make you trip as you walk through a door, such as a raised step or threshold. Trim any bushes or trees on paths to your home. Check to see if handrails are loose or  broken and that both sides of all steps have handrails. Install guardrails along the edges of any raised decks and porches. Clear paths of anything that can make you trip, such as tools or rocks. Have leaves, snow, or ice cleared regularly. Use sand or salt on paths during winter. Clean up any spills in your garage right away. This includes grease or oil spills. What other actions can I take? Wear shoes that: Have a low heel. Do not wear high heels. Have rubber bottoms. Feel good on your feet and fit well. Are closed at the toe. Do not wear open-toe sandals. Use tools that help you move around if needed. These include: Canes. Walkers. Scooters. Crutches. Review your medicines with your doctor. Some medicines can make you feel  dizzy. This can increase your chance of falling. Ask your doctor what else you can do to help prevent falls. Where to find more information Centers for Disease Control and Prevention, [employer]: http://www.wolf.info/ National Institute on Aging: http://kim-miller.com/ Contact a doctor if: You are afraid of falling at home. You feel weak, drowsy, or dizzy at home. You fall at home. Summary There are many simple things that you can do to make your home safe and to help prevent falls. Ways to make your home safe include removing things that can make you trip and installing grab bars in the bathroom. Ask for help when making these changes in your home. This information is not intended to replace advice given to you by your health care provider. Make sure you discuss any questions you have with your health care provider. Document Revised: 05/21/2020 Document Reviewed: 05/21/2020 Elsevier Patient Education  [place].

## 2021-11-10 ENCOUNTER — Ambulatory Visit (INDEPENDENT_AMBULATORY_CARE_PROVIDER_SITE_OTHER): Payer: Medicare Other

## 2021-11-10 DIAGNOSIS — R051 Acute cough: Secondary | ICD-10-CM | POA: Diagnosis not present

## 2021-11-10 LAB — POC COVID19 BINAXNOW: SARS Coronavirus 2 Ag: NEGATIVE

## 2021-11-16 ENCOUNTER — Ambulatory Visit: Payer: Medicare Other

## 2022-01-18 ENCOUNTER — Other Ambulatory Visit: Payer: Self-pay

## 2022-01-18 MED ORDER — BENEFIBER PO POWD: ORAL | 11 refills | Status: DC

## 2022-02-22 ENCOUNTER — Ambulatory Visit (INDEPENDENT_AMBULATORY_CARE_PROVIDER_SITE_OTHER): Payer: Medicare Other | Admitting: Nurse Practitioner

## 2022-02-22 ENCOUNTER — Encounter: Payer: Self-pay | Admitting: Nurse Practitioner

## 2022-02-22 VITALS — BP 118/72 | HR 90 | Temp 97.8°F | Resp 16 | Ht 64.0 in | Wt 165.0 lb

## 2022-02-22 DIAGNOSIS — E038 Other specified hypothyroidism: Secondary | ICD-10-CM

## 2022-02-24 ENCOUNTER — Ambulatory Visit: Payer: Medicare Other | Admitting: Nurse Practitioner

## 2022-02-25 ENCOUNTER — Ambulatory Visit (INDEPENDENT_AMBULATORY_CARE_PROVIDER_SITE_OTHER): Payer: Medicare Other | Admitting: Family Medicine

## 2022-02-25 DIAGNOSIS — R7303 Prediabetes: Secondary | ICD-10-CM

## 2022-02-25 DIAGNOSIS — F330 Major depressive disorder, recurrent, mild: Secondary | ICD-10-CM

## 2022-02-25 DIAGNOSIS — I119 Hypertensive heart disease without heart failure: Secondary | ICD-10-CM

## 2022-02-25 DIAGNOSIS — I2510 Atherosclerotic heart disease of native coronary artery without angina pectoris: Secondary | ICD-10-CM

## 2022-02-25 DIAGNOSIS — E039 Hypothyroidism, unspecified: Secondary | ICD-10-CM

## 2022-02-25 DIAGNOSIS — E782 Mixed hyperlipidemia: Secondary | ICD-10-CM

## 2022-02-25 NOTE — Progress Notes (Signed)
No show.  ?

## 2022-02-26 ENCOUNTER — Encounter: Payer: Self-pay | Admitting: Physician Assistant

## 2022-02-26 ENCOUNTER — Ambulatory Visit (INDEPENDENT_AMBULATORY_CARE_PROVIDER_SITE_OTHER): Payer: Medicare Other | Admitting: Physician Assistant

## 2022-02-26 VITALS — BP 116/82 | HR 89 | Temp 98.2°F | Ht 66.0 in | Wt 172.0 lb

## 2022-02-26 DIAGNOSIS — F79 Unspecified intellectual disabilities: Secondary | ICD-10-CM

## 2022-02-26 DIAGNOSIS — R7303 Prediabetes: Secondary | ICD-10-CM | POA: Diagnosis not present

## 2022-02-26 DIAGNOSIS — E039 Hypothyroidism, unspecified: Secondary | ICD-10-CM | POA: Diagnosis not present

## 2022-02-26 DIAGNOSIS — I2510 Atherosclerotic heart disease of native coronary artery without angina pectoris: Secondary | ICD-10-CM | POA: Diagnosis not present

## 2022-02-26 DIAGNOSIS — E782 Mixed hyperlipidemia: Secondary | ICD-10-CM

## 2022-02-26 DIAGNOSIS — R928 Other abnormal and inconclusive findings on diagnostic imaging of breast: Secondary | ICD-10-CM

## 2022-02-26 DIAGNOSIS — I483 Typical atrial flutter: Secondary | ICD-10-CM

## 2022-02-26 DIAGNOSIS — M818 Other osteoporosis without current pathological fracture: Secondary | ICD-10-CM

## 2022-02-26 DIAGNOSIS — I119 Hypertensive heart disease without heart failure: Secondary | ICD-10-CM

## 2022-02-26 NOTE — Progress Notes (Signed)
? ?Subjective:  ?Patient ID: Alexandra Barajas, female    DOB: 04-27-1951  Age: 71 y.o. MRN: [id number] ? ?Chief Complaint  ?Patient presents with  ? Hyperlipidemia  ? Hypertension  ? Hypothyroidism  ? ?Alexandra Barajas is a 71 year old Caucasian female that presents for fasting appointment. She is a resident of [facility] and is accompanied by staff member (Summer)  who assists with medical history questions. ? ?For her hypertensive heart disease and elevated cholesterol she is on zetia 10 mg once daily,ranexa bid, Losartan, metoprolol, hctz, lasix and repatha. ?She does follow with cardiology and her last appt was in 11/22 - follows on a yearly basis ? ?Patient takes omeprazole 41m for GERD and states she is not having any stomach issues at this time ? ?Pt has history of osteoporosis and was on fosamax but currently on a break from that medication - her last bone density was 3/21 and is due to schedule ? ?While looking through records is is noted she is also overdue for a left breast diagnostic ultrasound follow up ?  ?Current Outpatient Medications on File Prior to Visit  ?Medication Sig Dispense Refill  ? Calcium Carbonate-Vitamin D 600-400 MG-UNIT tablet Take 1 tablet by mouth in the morning and at bedtime. 60 tablet 11  ? chlorhexidine (PERIDEX) 0.12 % solution     ? Docusate Sodium (DSS) 100 MG CAPS Take 1 tablet by mouth daily. 30 capsule 11  ? Evolocumab (REPATHA SURECLICK) 1756MG/ML SOAJ Inject into the skin.    ? ezetimibe (ZETIA) 10 MG tablet Take 1 tablet (10 mg total) by mouth daily. 30 tablet 11  ? FLUoxetine (PROZAC) 20 MG capsule TAKE 4 CAPSULES (80 MG) BY MOUTH EVERY MORNING 120 capsule 11  ? furosemide (LASIX) 20 MG tablet Take 1 tablet (20 mg total) by mouth daily. 90 tablet 1  ? hydrochlorothiazide (HYDRODIURIL) 12.5 MG tablet Take 1 tablet (12.5 mg total) by mouth daily. 30 tablet 11  ? lactulose (CHRONULAC) 10 GM/15ML solution TAKE 15 ML BY MOUTH ONCE DAILY 473 mL 11  ? levothyroxine (SYNTHROID) 50  MCG tablet Take 1 tablet (50 mcg total) by mouth daily. 30 tablet 11  ? losartan (COZAAR) 50 MG tablet Take 1 tablet (50 mg total) by mouth daily. 30 tablet 11  ? metoprolol succinate (TOPROL-XL) 25 MG 24 hr tablet TAKE 1 TABLET BY MOUTH ONCE DAILY **DO NOT CRUSH* 30 tablet 11  ? omeprazole (PRILOSEC) 40 MG capsule TAKE ONE CAPSULE BY MOUTH EVERY DAY **DO NOT CRUSH* 30 capsule 11  ? Prenatal Vit-Fe Fumarate-FA (MULTIVITAMIN-PRENATAL) 27-0.8 MG TABS tablet Take 1 tablet by mouth daily at 12 noon. 30 tablet 11  ? ranolazine (RANEXA) 500 MG 12 hr tablet TAKE ONE TABLET BY MOUTH TWICE DAILY **DO NOT CRUSH* 60 tablet 11  ? Wheat Dextrin (BENEFIBER) POWD Stir 2 teaspoons of Benefiber into 4-8 oz of your favorite non-carbonated beverage or soft food (hot or cold). Stir well until dissolved. 2 teaspoons three times daily. Do not exceed 6 teaspoons per day. 730 g 11  ? ?No current facility-administered medications on file prior to visit.  ? ?Past Medical History:  ?Diagnosis Date  ? Aortic valve disease   ? S/P aortic valve replacement  ? Atherosclerotic heart disease of native coronary artery without angina pectoris   ? Atrophy of thyroid   ? Coronary artery disease 2003  ? cabg  ? Depression   ? Hyperlipidemia   ? Hypertension   ? Hypertensive heart disease without heart  failure   ? Moderate intellectual disabilities   ? Osteoporosis   ? Other nonrheumatic aortic valve disorders   ? Other specified disorders of bone density and structure, multiple sites   ? Presence of aortocoronary bypass graft   ? Presence of prosthetic heart valve   ? ?Past Surgical History:  ?Procedure Laterality Date  ? ABDOMINAL HYSTERECTOMY    ? BL oophorectomy  ? ANKLE SURGERY Left   ? CARDIAC VALVE REPLACEMENT    ? 2003 (aortic valve and mitral valve replacements.)  ? CORONARY ARTERY BYPASS GRAFT    ?  ?Family History  ?Problem Relation Age of Onset  ? Breast cancer Other   ? ?Social History  ? ?Socioeconomic History  ? Marital status: Single  ?   Spouse name: Not on file  ? Number of children: Not on file  ? Years of education: Not on file  ? Highest education level: Not on file  ?Occupational History  ? Not on file  ?Tobacco Use  ? Smoking status: Never  ? Smokeless tobacco: Never  ?Substance and Sexual Activity  ? Alcohol use: Never  ? Drug use: Never  ? Sexual activity: Not Currently  ?Other Topics Concern  ? Not on file  ?Social History Narrative  ? Lives at [facility]  ? ?Social Determinants of Health  ? ?Financial Resource Strain: Low Risk   ? Difficulty of Paying Living Expenses: Not hard at all  ?Food Insecurity: No Food Insecurity  ? Worried About Charity fundraiser in the Last Year: Never true  ? Ran Out of Food in the Last Year: Never true  ?Transportation Needs: No Transportation Needs  ? Lack of Transportation (Medical): No  ? Lack of Transportation (Non-Medical): No  ?Physical Activity: Not on file  ?Stress: No Stress Concern Present  ? Feeling of Stress : Not at all  ?Social Connections: Not on file  ? ?CONSTITUTIONAL: Negative for chills, fatigue, fever, unintentional weight gain and unintentional weight loss.  ?E/N/T: Negative for ear pain, nasal congestion and sore throat.  ?CARDIOVASCULAR: Negative for chest pain, dizziness, palpitations and pedal edema.  ?RESPIRATORY: Negative for recent cough and dyspnea.  ?GASTROINTESTINAL: Negative for abdominal pain, acid reflux symptoms, constipation, diarrhea, nausea and vomiting.  ?MSK: Negative for arthralgias and myalgias.  ?INTEGUMENTARY: Negative for rash.  ?NEUROLOGICAL: Negative for dizziness and headaches.  ?PSYCHIATRIC: Negative for sleep disturbance and to question depression screen.  Negative for depression, negative for anhedonia.  ?   ? ?Objective:  ?PHYSICAL EXAM:  ? ?VS: BP 116/82   Pulse 89   Temp 98.2 ?F (36.8 ?C)   Ht 5' 6" (1.676 m)   Wt 172 lb (78 kg)   SpO2 94%   BMI 27.76 kg/m?  ? ?GEN: Well nourished, well developed, in no acute distress  ?Neck: no JVD or masses - no  thyromegaly ?Cardiac: RRR; no murmurs, rubs, or gallops,no edema - ?Respiratory:  normal respiratory rate and pattern with no distress - normal breath sounds with no rales, rhonchi, wheezes or rubs ?GI: normal bowel sounds, no masses or tenderness ?MS: no deformity or atrophy  ?Skin: warm and dry, no rash  ?Psych: euthymic mood, appropriate affect and demeanor ? ?Diabetic Foot Exam - Simple   ?No data filed ?  ?  ? ?Lab Results  ?Component Value Date  ? WBC 5.6 08/26/2021  ? HGB 14.9 08/26/2021  ? HCT 45.1 08/26/2021  ? PLT 132 (L) 08/26/2021  ? GLUCOSE 101 (H) 08/26/2021  ?  CHOL 173 08/26/2021  ? TRIG 105 08/26/2021  ? HDL 62 08/26/2021  ? Blanket 92 08/26/2021  ? ALT 71 (H) 08/26/2021  ? AST 65 (H) 08/26/2021  ? NA 137 08/26/2021  ? K 4.5 08/26/2021  ? CL 97 08/26/2021  ? CREATININE 0.83 08/26/2021  ? BUN 13 08/26/2021  ? CO2 25 08/26/2021  ? TSH 2.020 08/26/2021  ? HGBA1C 5.7 (H) 08/26/2021  ? ? ? ? ?Assessment & Plan:  ? ?Problem List Items Addressed This Visit   ? ?  ? Cardiovascular and Mediastinum  ? Coronary artery disease involving native coronary artery of native heart without angina pectoris - Primary  ? Relevant Orders  ? CBC with Differential/Platelet  ? Comprehensive metabolic panel  ? Typical atrial flutter ([place])  ? Hypertension with heart disease  ?  ? Endocrine  ? Acquired hypothyroidism  ? Relevant Orders  ? TSH  ?  ? Other  ? Mixed hyperlipidemia  ? Relevant Orders  ? Lipid panel  ? Mental disability  ? Prediabetes  ? Relevant Orders  ? Hemoglobin A1c  ? ?Other Visit Diagnoses   ? ? Abnormal mammogram      ? Relevant Orders  ? MM Digital Diagnostic Unilat L  ? Other osteoporosis without current pathological fracture      ? Relevant Orders  ? DG Bone Density  ? ?  ?. ? ?No orders of the defined types were placed in this encounter. ? ? ?Orders Placed This Encounter  ?Procedures  ? DG Bone Density  ? MM Digital Diagnostic Unilat L  ? CBC with Differential/Platelet  ? Comprehensive metabolic panel  ?  TSH  ? Lipid panel  ? Hemoglobin A1c  ? ?  ? ?Follow-up: Return in about 6 months (around 08/28/2022) for chronic fasting with Dr Tobie Poet. ? ?An After Visit Summary was printed and given to the patient. ? ?SARA R

## 2022-02-27 LAB — COMPREHENSIVE METABOLIC PANEL
ALT: 50 IU/L — ABNORMAL HIGH (ref 0–32)
AST: 52 IU/L — ABNORMAL HIGH (ref 0–40)
Albumin/Globulin Ratio: 1.4 (ref 1.2–2.2)
Albumin: 4.1 g/dL (ref 3.7–4.7)
Alkaline Phosphatase: 92 IU/L (ref 44–121)
BUN/Creatinine Ratio: 13 (ref 12–28)
BUN: 10 mg/dL (ref 8–27)
Bilirubin Total: 0.4 mg/dL (ref 0.0–1.2)
CO2: 28 mmol/L (ref 20–29)
Calcium: 9.7 mg/dL (ref 8.7–10.3)
Chloride: 95 mmol/L — ABNORMAL LOW (ref 96–106)
Creatinine, Ser: 0.8 mg/dL (ref 0.57–1.00)
Globulin, Total: 2.9 g/dL (ref 1.5–4.5)
Glucose: 103 mg/dL — ABNORMAL HIGH (ref 70–99)
Potassium: 4.5 mmol/L (ref 3.5–5.2)
Sodium: 134 mmol/L (ref 134–144)
Total Protein: 7 g/dL (ref 6.0–8.5)
eGFR: 78 mL/min/{1.73_m2} (ref >59)

## 2022-02-27 LAB — CBC WITH DIFFERENTIAL/PLATELET
Basophils Absolute: 0 10*3/uL (ref 0.0–0.2)
Basos: 1 %
EOS (ABSOLUTE): 0.2 10*3/uL (ref 0.0–0.4)
Eos: 3 %
Hematocrit: 40.8 % (ref 34.0–46.6)
Hemoglobin: 13.9 g/dL (ref 11.1–15.9)
Immature Grans (Abs): 0 10*3/uL (ref 0.0–0.1)
Immature Granulocytes: 0 %
Lymphocytes Absolute: 1.9 10*3/uL (ref 0.7–3.1)
Lymphs: 31 %
MCH: 32 pg (ref 26.6–33.0)
MCHC: 34.1 g/dL (ref 31.5–35.7)
MCV: 94 fL (ref 79–97)
Monocytes Absolute: 0.7 10*3/uL (ref 0.1–0.9)
Monocytes: 11 %
Neutrophils Absolute: 3.4 10*3/uL (ref 1.4–7.0)
Neutrophils: 54 %
Platelets: 141 10*3/uL — ABNORMAL LOW (ref 150–450)
RBC: 4.34 x10E6/uL (ref 3.77–5.28)
RDW: 12.4 % (ref 11.7–15.4)
WBC: 6.2 10*3/uL (ref 3.4–10.8)

## 2022-02-27 LAB — LIPID PANEL
Chol/HDL Ratio: 2.7 ratio (ref 0.0–4.4)
Cholesterol, Total: 159 mg/dL (ref 100–199)
HDL: 60 mg/dL (ref >39)
LDL Chol Calc (NIH): 81 mg/dL (ref 0–99)
Triglycerides: 96 mg/dL (ref 0–149)
VLDL Cholesterol Cal: 18 mg/dL (ref 5–40)

## 2022-02-27 LAB — HEMOGLOBIN A1C
Est. average glucose Bld gHb Est-mCnc: 117 mg/dL
Hgb A1c MFr Bld: 5.7 % — ABNORMAL HIGH (ref 4.8–5.6)

## 2022-02-27 LAB — TSH: TSH: 2.79 u[IU]/mL (ref 0.450–4.500)

## 2022-02-27 LAB — CARDIOVASCULAR RISK ASSESSMENT

## 2022-03-02 ENCOUNTER — Other Ambulatory Visit: Payer: Self-pay

## 2022-03-02 DIAGNOSIS — E038 Other specified hypothyroidism: Secondary | ICD-10-CM

## 2022-03-02 DIAGNOSIS — E039 Hypothyroidism, unspecified: Secondary | ICD-10-CM

## 2022-03-02 DIAGNOSIS — R0789 Other chest pain: Secondary | ICD-10-CM

## 2022-03-02 DIAGNOSIS — R1013 Epigastric pain: Secondary | ICD-10-CM

## 2022-03-02 DIAGNOSIS — I483 Typical atrial flutter: Secondary | ICD-10-CM

## 2022-03-03 ENCOUNTER — Telehealth: Payer: Self-pay | Admitting: Family Medicine

## 2022-03-03 MED ORDER — HYDROCHLOROTHIAZIDE 12.5 MG PO TABS: 12.5000 mg | ORAL_TABLET | Freq: Every day | ORAL | 11 refills | Status: DC

## 2022-03-03 MED ORDER — OMEPRAZOLE 40 MG PO CPDR: DELAYED_RELEASE_CAPSULE | ORAL | 11 refills | Status: DC

## 2022-03-03 MED ORDER — RANOLAZINE ER 500 MG PO TB12: ORAL_TABLET | ORAL | 11 refills | Status: DC

## 2022-03-03 MED ORDER — LOSARTAN POTASSIUM 50 MG PO TABS: 50.0000 mg | ORAL_TABLET | Freq: Every day | ORAL | 11 refills | Status: DC

## 2022-03-03 MED ORDER — LEVOTHYROXINE SODIUM 50 MCG PO TABS: 50.0000 ug | ORAL_TABLET | Freq: Every day | ORAL | 11 refills | Status: DC

## 2022-03-03 MED ORDER — EZETIMIBE 10 MG PO TABS: 10.0000 mg | ORAL_TABLET | Freq: Every day | ORAL | 11 refills | Status: DC

## 2022-03-03 MED ORDER — FLUOXETINE HCL 20 MG PO CAPS
ORAL_CAPSULE | ORAL | 11 refills | Status: DC
Start: 2022-03-03 — End: 2023-03-07

## 2022-03-03 MED ORDER — METOPROLOL SUCCINATE ER 25 MG PO TB24: ORAL_TABLET | ORAL | 11 refills | Status: DC

## 2022-03-03 NOTE — Telephone Encounter (Signed)
? ?  Alexandra Barajas has been scheduled for the following appointment: ? ?WHAT: DIAGNOSTIC MAMMOGRAM AND BONE DENSITY ?WHERE: [place] ?DATE: 04/28/22 ?TIME: 1:30 PM CHECK IN ? ?Patient has been made aware. ?SPOKE TO PT'S CAREGIVER GWEN ?

## 2022-03-04 ENCOUNTER — Ambulatory Visit: Payer: Medicare Other | Admitting: Nurse Practitioner

## 2022-04-11 NOTE — Progress Notes (Signed)
Cancelled.

## 2022-04-29 ENCOUNTER — Other Ambulatory Visit: Payer: Self-pay

## 2022-04-29 DIAGNOSIS — R928 Other abnormal and inconclusive findings on diagnostic imaging of breast: Secondary | ICD-10-CM

## 2022-05-06 ENCOUNTER — Other Ambulatory Visit: Payer: Self-pay

## 2022-05-06 DIAGNOSIS — M818 Other osteoporosis without current pathological fracture: Secondary | ICD-10-CM

## 2022-05-06 MED ORDER — ALENDRONATE SODIUM 70 MG PO TABS: 70.0000 mg | ORAL_TABLET | ORAL | 11 refills | Status: DC

## 2022-06-14 ENCOUNTER — Telehealth: Payer: Self-pay

## 2022-06-14 NOTE — Telephone Encounter (Signed)
[facility] called stating that they need a new Hand written order faxed to them stating that patient only needs to take the Benefiber once daily instead of three times daily. Then they are needing a new Rx with the new instructions sent to her pharmacy because they state that the patient can't take it up to 3 times daily because the patient would be in the bathroom all day if she did so she only takes the medication once at night time.

## 2022-06-15 NOTE — Telephone Encounter (Signed)
Done. Dr. Tobie Poet

## 2022-06-15 NOTE — Telephone Encounter (Signed)
New D/C order and new rx order has been hand written out. Please send in new RX for the Benefiber 2 teaspoons once at night time to patient's pharmacy.

## 2022-07-23 ENCOUNTER — Ambulatory Visit: Payer: Medicare Other

## 2022-07-26 ENCOUNTER — Other Ambulatory Visit: Payer: Self-pay

## 2022-08-03 ENCOUNTER — Ambulatory Visit (INDEPENDENT_AMBULATORY_CARE_PROVIDER_SITE_OTHER): Payer: Medicare Other | Admitting: Family Medicine

## 2022-08-03 VITALS — BP 110/60 | HR 72 | Temp 96.1°F | Resp 14 | Ht 66.0 in | Wt 166.0 lb

## 2022-08-03 DIAGNOSIS — F330 Major depressive disorder, recurrent, mild: Secondary | ICD-10-CM

## 2022-08-03 DIAGNOSIS — M810 Age-related osteoporosis without current pathological fracture: Secondary | ICD-10-CM | POA: Diagnosis not present

## 2022-08-03 DIAGNOSIS — Z23 Encounter for immunization: Secondary | ICD-10-CM

## 2022-08-03 DIAGNOSIS — M791 Myalgia, unspecified site: Secondary | ICD-10-CM

## 2022-08-03 DIAGNOSIS — I2510 Atherosclerotic heart disease of native coronary artery without angina pectoris: Secondary | ICD-10-CM

## 2022-08-03 DIAGNOSIS — E782 Mixed hyperlipidemia: Secondary | ICD-10-CM

## 2022-08-03 DIAGNOSIS — E039 Hypothyroidism, unspecified: Secondary | ICD-10-CM | POA: Diagnosis not present

## 2022-08-03 DIAGNOSIS — F79 Unspecified intellectual disabilities: Secondary | ICD-10-CM

## 2022-08-03 DIAGNOSIS — T466X5A Adverse effect of antihyperlipidemic and antiarteriosclerotic drugs, initial encounter: Secondary | ICD-10-CM

## 2022-08-03 DIAGNOSIS — Z6828 Body mass index (BMI) 28.0-28.9, adult: Secondary | ICD-10-CM

## 2022-08-03 DIAGNOSIS — R7303 Prediabetes: Secondary | ICD-10-CM

## 2022-08-03 DIAGNOSIS — I483 Typical atrial flutter: Secondary | ICD-10-CM

## 2022-08-03 NOTE — Progress Notes (Signed)
Subjective:  Patient ID: Alexandra Barajas, female    DOB: Feb 28, 1951  Age: 71 y.o. MRN: [id number]  Chief Complaint  Patient presents with   Hypothyroidism   Hyperlipidemia    HPI Alexandra Barajas is a 71 year old Caucasian female that presents for fasting appointment. She is a resident of [facility] and is accompanied by staff member Meredith Mody)  who assists with medical history questions.  Hypertensive heart disease: Patient is currently on Ranexa 500 mg twice daily, losartan 50 mg daily, Lasix 20 mg daily, metoprolol succinate 25 mg 1 daily, HCTZ 12.5 mg daily.  Also takes Repatha.  Hyperlipidemia: Currently on Zetia 10 mg daily and Repatha 140 mg every 2 weeks.  GERD: On omeprazole 40 mg daily reflux is well controlled.   Osteoporosis: Fosamax currently on holiday.  Last bone density was spring 2023.  Depression: Currently on Prozac 20 mg4 capsules daily Current Outpatient Medications on File Prior to Visit  Medication Sig Dispense Refill   Alirocumab 75 MG/ML SOAJ Inject into the skin every 14 (fourteen) days.     Calcium Carbonate-Vitamin D 600-400 MG-UNIT tablet Take 1 tablet by mouth in the morning and at bedtime. 60 tablet 11   chlorhexidine (PERIDEX) 0.12 % solution      Docusate Sodium (DSS) 100 MG CAPS Take 1 tablet by mouth daily. 30 capsule 11   Evolocumab (REPATHA SURECLICK) 144 MG/ML SOAJ Inject into the skin.     ezetimibe (ZETIA) 10 MG tablet Take 1 tablet (10 mg total) by mouth daily. 30 tablet 11   FLUoxetine (PROZAC) 20 MG capsule TAKE 4 CAPSULES (80 MG) BY MOUTH EVERY MORNING 120 capsule 11   furosemide (LASIX) 20 MG tablet Take 1 tablet (20 mg total) by mouth daily. 90 tablet 1   hydrochlorothiazide (HYDRODIURIL) 12.5 MG tablet Take 1 tablet (12.5 mg total) by mouth daily. 30 tablet 11   lactulose (CHRONULAC) 10 GM/15ML solution TAKE 15 ML BY MOUTH ONCE DAILY 473 mL 11   levothyroxine (SYNTHROID) 50 MCG tablet Take 1 tablet (50 mcg total) by mouth daily. 30 tablet 11    losartan (COZAAR) 50 MG tablet Take 1 tablet (50 mg total) by mouth daily. 30 tablet 11   metoprolol succinate (TOPROL-XL) 25 MG 24 hr tablet TAKE 1 TABLET BY MOUTH ONCE DAILY **DO NOT CRUSH* 30 tablet 11   omeprazole (PRILOSEC) 40 MG capsule TAKE ONE CAPSULE BY MOUTH EVERY DAY **DO NOT CRUSH* 30 capsule 11   Prenatal Vit-Fe Fumarate-FA (MULTIVITAMIN-PRENATAL) 27-0.8 MG TABS tablet Take 1 tablet by mouth daily at 12 noon. 30 tablet 11   ranolazine (RANEXA) 500 MG 12 hr tablet TAKE ONE TABLET BY MOUTH TWICE DAILY **DO NOT CRUSH* 60 tablet 11   Wheat Dextrin (BENEFIBER) POWD Stir 2 teaspoons of Benefiber into 4-8 oz of your favorite non-carbonated beverage or soft food (hot or cold). Stir well until dissolved. 2 teaspoons three times daily. Do not exceed 6 teaspoons per day. 730 g 11   No current facility-administered medications on file prior to visit.   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    Atrophy of thyroid    Coronary artery disease 2003   cabg   Depression    Hyperlipidemia    Hypertension    Hypertensive heart disease without heart failure    Moderate intellectual disabilities    Osteoporosis  Other nonrheumatic aortic valve disorders    Other specified disorders of bone density and structure, multiple sites    Presence of aortocoronary bypass graft    Presence of prosthetic heart valve    Past Surgical History:  Procedure Laterality Date   ABDOMINAL HYSTERECTOMY     BL oophorectomy   ANKLE SURGERY Left    CARDIAC VALVE REPLACEMENT     2003 (aortic valve and mitral valve replacements.)   CORONARY ARTERY BYPASS GRAFT      Family History  Problem Relation Age of Onset   Breast cancer Other    Social History   Socioeconomic History   Marital status: Single    Spouse name: Not on file   Number of children: Not on file   Years  of education: Not on file   Highest education level: Not on file  Occupational History   Not on file  Tobacco Use   Smoking status: Never   Smokeless tobacco: Never  Substance and Sexual Activity   Alcohol use: Never   Drug use: Never   Sexual activity: Not Currently  Other Topics Concern   Not on file  Social History Narrative   Lives at [facility] Determinants of Health   Financial Resource Strain: Low Risk  (08/15/2021)   Overall Financial Resource Strain (CARDIA)    Difficulty of Paying Living Expenses: Not hard at all  Food Insecurity: No Food Insecurity (07/20/2021)   Hunger Vital Sign    Worried About Running Out of Food in the Last Year: Never true    Ran Out of Food in the Last Year: Never true  Transportation Needs: No Transportation Needs (07/20/2021)   PRAPARE - Hydrologist (Medical): No    Lack of Transportation (Non-Medical): No  Physical Activity: Not on file  Stress: No Stress Concern Present (07/20/2021)   [place]    Feeling of Stress : Not at all  Social Connections: Not on file    Review of Systems  Constitutional:  Negative for chills, fatigue and fever.  HENT:  Negative for congestion, rhinorrhea and sore throat.   Respiratory:  Negative for cough and shortness of breath.   Cardiovascular:  Negative for chest pain.  Gastrointestinal:  Negative for abdominal pain, constipation, diarrhea, nausea and vomiting.  Genitourinary:  Negative for dysuria and urgency.  Musculoskeletal:  Negative for back pain and myalgias.  Neurological:  Negative for dizziness, weakness, light-headedness and headaches.  Psychiatric/Behavioral:  Negative for dysphoric mood. The patient is not nervous/anxious.      Objective:  BP 110/60   Pulse 72   Temp (!) 96.1 F (35.6 C)   Resp 14   Ht 5' 6" (1.676 m)   Wt 166 lb (75.3 kg)   BMI 26.79 kg/m      08/03/2022     9:44 AM 02/26/2022    9:47 AM 02/22/2022   10:17 AM  BP/Weight  Systolic BP 580 998 338  Diastolic BP 60 82 72  Wt. (Lbs) 166 172 165  BMI 26.79 kg/m2 27.76 kg/m2 28.32 kg/m2    Physical Exam Vitals reviewed.  Constitutional:      Comments: Disheveled.  Poor dentition.  HENT:     Right Ear: Tympanic membrane normal.     Left Ear: Tympanic membrane normal.     Nose: No congestion or rhinorrhea.     Mouth/Throat:     Dentition: Abnormal dentition.  Dental caries present.  Neck:     Vascular: No carotid bruit.  Cardiovascular:     Rate and Rhythm: Normal rate and regular rhythm.     Heart sounds: Normal heart sounds.  Pulmonary:     Effort: Pulmonary effort is normal. No respiratory distress.     Breath sounds: Normal breath sounds.  Abdominal:     General: Abdomen is flat. Bowel sounds are normal.     Palpations: Abdomen is soft.     Tenderness: There is no abdominal tenderness.  Neurological:     Mental Status: She is alert.  Psychiatric:        Mood and Affect: Mood normal.        Behavior: Behavior normal.     Diabetic Foot Exam - Simple   No data filed      Lab Results  Component Value Date   WBC 6.2 08/03/2022   HGB 14.7 08/03/2022   HCT 44.1 08/03/2022   PLT 145 (L) 08/03/2022   GLUCOSE 113 (H) 08/03/2022   CHOL 194 08/03/2022   TRIG 104 08/03/2022   HDL 74 08/03/2022   LDLCALC 102 (H) 08/03/2022   ALT 62 (H) 08/03/2022   AST 63 (H) 08/03/2022   NA 136 08/03/2022   K 4.8 08/03/2022   CL 96 08/03/2022   CREATININE 0.88 08/03/2022   BUN 14 08/03/2022   CO2 25 08/03/2022   TSH 2.790 02/26/2022   HGBA1C 5.9 (H) 08/03/2022      Assessment & Plan:   Problem List Items Addressed This Visit       Cardiovascular and Mediastinum   Coronary artery disease involving native coronary artery of native heart without angina pectoris - Primary    Follow-up with specialist. Continue Ranexa 500 mg twice daily, losartan 50 mg daily, metoprolol succinate 25 mg  daily, Lasix 20 mg daily, and hydrochlorothiazide 12.5 mg daily.  Continue Repatha and Zetia.      Relevant Orders   CBC with Differential/Platelet (Completed)   Comprehensive metabolic panel (Completed)   Typical atrial flutter (HCC)    Continue Toprol-XL.        Endocrine   Acquired hypothyroidism    Previously well controlled Continue Synthroid at current dose  Recheck TSH and adjust Synthroid as indicated          Musculoskeletal and Integument   Age-related osteoporosis without current pathological fracture    Consider currently.  Continue calcium with vitamin D.      Relevant Orders   VITAMIN D 25 Hydroxy (Vit-D Deficiency, Fractures) (Completed)     Other   Mixed hyperlipidemia    Well controlled.  No changes to medicines.  Continue Zetia 10 mg daily and Repatha 140 mg every 2 weeks. Continue to work on eating a healthy diet and exercise. Labs drawn today.        Relevant Orders   Lipid panel (Completed)   Major depressive disorder, recurrent episode, mild (HCC)    Continue Prozac 20 mg 4 daily.      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      Mental disability    Recommended continue group home.      Prediabetes     Continue to work on healthy diet and exercise.        Relevant Orders   Hemoglobin A1c (Completed)   BMI 28.0-28.9,adult    Continue to work on he      Other Visit Diagnoses  Need for immunization against influenza       Relevant Orders   Flu Vaccine QUAD High Dose(Fluad) (Completed)     .  No orders of the defined types were placed in this encounter.   Orders Placed This Encounter  Procedures   Flu Vaccine QUAD High Dose(Fluad)   CBC with Differential/Platelet   Comprehensive metabolic panel   Hemoglobin A1c   Lipid panel   VITAMIN D 25 Hydroxy (Vit-D Deficiency, Fractures)   Cardiovascular Risk Assessment     Follow-up: Return in about 6 months (around 02/02/2023) for chronic  fasting, awv due anytime. Marland Kitchen  An After Visit Summary was printed and given to the patient.  Rochel Brome, MD [facility] [phone]

## 2022-08-04 LAB — CBC WITH DIFFERENTIAL/PLATELET
Basophils Absolute: 0 10*3/uL (ref 0.0–0.2)
Basos: 1 %
EOS (ABSOLUTE): 0.2 10*3/uL (ref 0.0–0.4)
Eos: 2 %
Hematocrit: 44.1 % (ref 34.0–46.6)
Hemoglobin: 14.7 g/dL (ref 11.1–15.9)
Immature Grans (Abs): 0 10*3/uL (ref 0.0–0.1)
Immature Granulocytes: 0 %
Lymphocytes Absolute: 1.9 10*3/uL (ref 0.7–3.1)
Lymphs: 31 %
MCH: 31.3 pg (ref 26.6–33.0)
MCHC: 33.3 g/dL (ref 31.5–35.7)
MCV: 94 fL (ref 79–97)
Monocytes Absolute: 0.7 10*3/uL (ref 0.1–0.9)
Monocytes: 12 %
Neutrophils Absolute: 3.4 10*3/uL (ref 1.4–7.0)
Neutrophils: 54 %
Platelets: 145 10*3/uL — ABNORMAL LOW (ref 150–450)
RBC: 4.7 x10E6/uL (ref 3.77–5.28)
RDW: 11.7 % (ref 11.7–15.4)
WBC: 6.2 10*3/uL (ref 3.4–10.8)

## 2022-08-04 LAB — LIPID PANEL
Chol/HDL Ratio: 2.6 ratio (ref 0.0–4.4)
Cholesterol, Total: 194 mg/dL (ref 100–199)
HDL: 74 mg/dL (ref >39)
LDL Chol Calc (NIH): 102 mg/dL — ABNORMAL HIGH (ref 0–99)
Triglycerides: 104 mg/dL (ref 0–149)
VLDL Cholesterol Cal: 18 mg/dL (ref 5–40)

## 2022-08-04 LAB — VITAMIN D 25 HYDROXY (VIT D DEFICIENCY, FRACTURES): Vit D, 25-Hydroxy: 41.2 ng/mL (ref 30.0–100.0)

## 2022-08-04 LAB — COMPREHENSIVE METABOLIC PANEL
ALT: 62 IU/L — ABNORMAL HIGH (ref 0–32)
AST: 63 IU/L — ABNORMAL HIGH (ref 0–40)
Albumin/Globulin Ratio: 1.5 (ref 1.2–2.2)
Albumin: 4.4 g/dL (ref 3.8–4.8)
Alkaline Phosphatase: 79 IU/L (ref 44–121)
BUN/Creatinine Ratio: 16 (ref 12–28)
BUN: 14 mg/dL (ref 8–27)
Bilirubin Total: 0.4 mg/dL (ref 0.0–1.2)
CO2: 25 mmol/L (ref 20–29)
Calcium: 9.9 mg/dL (ref 8.7–10.3)
Chloride: 96 mmol/L (ref 96–106)
Creatinine, Ser: 0.88 mg/dL (ref 0.57–1.00)
Globulin, Total: 2.9 g/dL (ref 1.5–4.5)
Glucose: 113 mg/dL — ABNORMAL HIGH (ref 70–99)
Potassium: 4.8 mmol/L (ref 3.5–5.2)
Sodium: 136 mmol/L (ref 134–144)
Total Protein: 7.3 g/dL (ref 6.0–8.5)
eGFR: 70 mL/min/{1.73_m2} (ref >59)

## 2022-08-04 LAB — HEMOGLOBIN A1C
Est. average glucose Bld gHb Est-mCnc: 123 mg/dL
Hgb A1c MFr Bld: 5.9 % — ABNORMAL HIGH (ref 4.8–5.6)

## 2022-08-04 LAB — CARDIOVASCULAR RISK ASSESSMENT

## 2022-08-04 NOTE — Progress Notes (Signed)
Blood count abnormal, but stable. Platelets little low.  Liver function abnormal, but stable.  Kidney function normal.  Vitamin D normal Cholesterol: good HBA1C: 5.9. good.

## 2022-08-06 ENCOUNTER — Encounter: Payer: Self-pay | Admitting: Family Medicine

## 2022-08-06 NOTE — Assessment & Plan Note (Signed)
Intolerant to statins.

## 2022-08-06 NOTE — Assessment & Plan Note (Signed)
Follow-up with specialist. Continue Ranexa 500 mg twice daily, losartan 50 mg daily, metoprolol succinate 25 mg daily, Lasix 20 mg daily, and hydrochlorothiazide 12.5 mg daily.  Continue Repatha and Zetia.

## 2022-08-06 NOTE — Assessment & Plan Note (Signed)
Continue Prozac 20 mg 4 daily.

## 2022-08-06 NOTE — Assessment & Plan Note (Addendum)
  Continue to work on [employer] and exercise.

## 2022-08-06 NOTE — Assessment & Plan Note (Signed)
Continue Toprol-XL.

## 2022-08-06 NOTE — Assessment & Plan Note (Signed)
Well controlled.  No changes to medicines.  Continue Zetia 10 mg daily and Repatha 140 mg every 2 weeks. Continue to work on eating a healthy diet and exercise. Labs drawn today.

## 2022-08-06 NOTE — Assessment & Plan Note (Signed)
Consider currently.  Continue calcium with vitamin D.

## 2022-08-06 NOTE — Assessment & Plan Note (Signed)
Continue to work on he

## 2022-08-06 NOTE — Assessment & Plan Note (Signed)
Previously well controlled Continue Synthroid at current dose  Recheck TSH and adjust Synthroid as indicated

## 2022-08-06 NOTE — Assessment & Plan Note (Signed)
Recommended continue group home.

## 2022-08-17 ENCOUNTER — Ambulatory Visit: Payer: Medicare Other | Admitting: Physician Assistant

## 2022-08-19 ENCOUNTER — Encounter: Payer: Medicare Other | Admitting: Nurse Practitioner

## 2022-08-20 ENCOUNTER — Other Ambulatory Visit: Payer: Self-pay | Admitting: Family Medicine

## 2022-09-02 ENCOUNTER — Ambulatory Visit: Payer: Medicare Other

## 2022-10-06 ENCOUNTER — Other Ambulatory Visit: Payer: Self-pay | Admitting: Family Medicine

## 2022-10-07 ENCOUNTER — Other Ambulatory Visit: Payer: Self-pay

## 2022-10-07 MED ORDER — FUROSEMIDE 20 MG PO TABS: 20.0000 mg | ORAL_TABLET | Freq: Every day | ORAL | 5 refills | Status: DC

## 2022-10-08 ENCOUNTER — Other Ambulatory Visit: Payer: Self-pay

## 2022-10-08 MED ORDER — FUROSEMIDE 20 MG PO TABS: 20.0000 mg | ORAL_TABLET | Freq: Every day | ORAL | 5 refills | Status: DC

## 2022-11-26 ENCOUNTER — Other Ambulatory Visit: Payer: Self-pay | Admitting: Family Medicine

## 2023-01-03 ENCOUNTER — Ambulatory Visit (INDEPENDENT_AMBULATORY_CARE_PROVIDER_SITE_OTHER): Payer: Medicare Other | Admitting: Family Medicine

## 2023-01-03 VITALS — BP 128/72 | HR 88 | Temp 97.2°F | Ht 66.0 in | Wt 164.0 lb

## 2023-01-03 DIAGNOSIS — I2510 Atherosclerotic heart disease of native coronary artery without angina pectoris: Secondary | ICD-10-CM | POA: Diagnosis not present

## 2023-01-03 DIAGNOSIS — F330 Major depressive disorder, recurrent, mild: Secondary | ICD-10-CM

## 2023-01-03 DIAGNOSIS — I119 Hypertensive heart disease without heart failure: Secondary | ICD-10-CM

## 2023-01-03 DIAGNOSIS — Z0000 Encounter for general adult medical examination without abnormal findings: Secondary | ICD-10-CM

## 2023-01-03 DIAGNOSIS — R7303 Prediabetes: Secondary | ICD-10-CM

## 2023-01-03 DIAGNOSIS — E039 Hypothyroidism, unspecified: Secondary | ICD-10-CM

## 2023-01-03 DIAGNOSIS — F79 Unspecified intellectual disabilities: Secondary | ICD-10-CM

## 2023-01-03 DIAGNOSIS — I483 Typical atrial flutter: Secondary | ICD-10-CM

## 2023-01-03 NOTE — Progress Notes (Unsigned)
Subjective:   Alexandra Barajas is a 72 y.o. female who presents for Medicare Annual (Subsequent) preventive examination as well as a physical. Patient is a mentally disabled adult with CORONARY ARTERY DISEASE, HYPERTENSION, A. FLUTTER, ACQUIRED HYPOTHYROIDISM, PREDIABETES AND DEPRESSION.    Review of Systems    Review of Systems  Constitutional:  Negative for chills, fever and malaise/fatigue.  HENT:  Negative for ear pain, sinus pain and sore throat.   Respiratory:  Negative for cough and shortness of breath.   Cardiovascular:  Negative for chest pain.  Musculoskeletal:  Negative for myalgias.  Neurological:  Negative for headaches.   Physical Exam Constitutional:      Appearance: Normal appearance.  HENT:     Right Ear: Tympanic membrane normal.     Left Ear: Tympanic membrane normal.  Cardiovascular:     Rate and Rhythm: Normal rate and regular rhythm.     Pulses: Normal pulses.     Heart sounds: No murmur heard. Abdominal:     General: Abdomen is flat. Bowel sounds are normal.     Palpations: Abdomen is soft.  Neurological:     Mental Status: She is alert.     Cardiac Risk Factors include: advanced age (>51mn, >>43women)     Objective:    Today's Vitals   01/03/23 1401  BP: 128/72  Pulse: 88  Temp: (!) 97.2 F (36.2 C)  SpO2: 99%  Weight: 164 lb (74.4 kg)  Height: '5\' 6"'$  (1.676 m)   Body mass index is 26.47 kg/m.     01/03/2023    2:09 PM 07/20/2021    2:55 PM  Advanced Directives  Does Patient Have a Medical Advance Directive? Yes Yes;Unable to assess, patient is non-responsive or altered mental status  Does patient want to make changes to medical advance directive? Yes (ED - Information included in AVS)     Current Medications (verified) Outpatient Encounter Medications as of 01/03/2023  Medication Sig   alendronate (FOSAMAX) 70 MG tablet Take 70 mg by mouth once a week.   chlorhexidine (PERIDEX) 0.12 % solution RINSE WITH 1 TEASPOONFUL (5 ML) EVERY  MORNING   Docusate Sodium (DSS) 100 MG CAPS Take 1 tablet by mouth daily.   Evolocumab (REPATHA SURECLICK) [id number]/ML SOAJ Inject into the skin.   FLUoxetine (PROZAC) 20 MG capsule TAKE 4 CAPSULES (80 MG) BY MOUTH EVERY MORNING   hydrochlorothiazide (HYDRODIURIL) 12.5 MG tablet Take 1 tablet (12.5 mg total) by mouth daily.   lactulose (CHRONULAC) 10 GM/15ML solution TAKE 15 ML BY MOUTH ONCE DAILY   levothyroxine (SYNTHROID) 50 MCG tablet Take 1 tablet (50 mcg total) by mouth daily.   loratadine (CLARITIN) 10 MG tablet Take by mouth.   metoprolol succinate (TOPROL-XL) 25 MG 24 hr tablet TAKE 1 TABLET BY MOUTH ONCE DAILY **DO NOT CRUSH*   ranolazine (RANEXA) 500 MG 12 hr tablet TAKE ONE TABLET BY MOUTH TWICE DAILY **DO NOT CRUSH*   Wheat Dextrin (BENEFIBER) POWD Stir 2 teaspoons of Benefiber into 4-8 oz of your favorite non-carbonated beverage or soft food (hot or cold). Stir well until dissolved. 2 teaspoons three times daily. Do not exceed 6 teaspoons per day.   [DISCONTINUED] Alirocumab 75 MG/ML SOAJ Inject into the skin every 14 (fourteen) days.   [DISCONTINUED] Calcium Carbonate-Vitamin D 600-400 MG-UNIT tablet Take 1 tablet by mouth in the morning and at bedtime.   [DISCONTINUED] ezetimibe (ZETIA) 10 MG tablet Take 1 tablet (10 mg total) by mouth daily.   [DISCONTINUED]  furosemide (LASIX) 20 MG tablet Take 1 tablet (20 mg total) by mouth daily.   [DISCONTINUED] losartan (COZAAR) 50 MG tablet Take 1 tablet (50 mg total) by mouth daily.   [DISCONTINUED] omeprazole (PRILOSEC) 40 MG capsule TAKE ONE CAPSULE BY MOUTH EVERY DAY **DO NOT CRUSH*   [DISCONTINUED] Prenatal Vit-Fe Fumarate-FA (MULTIVITAMIN-PRENATAL) 27-0.8 MG TABS tablet Take 1 tablet by mouth daily at 12 noon.   No facility-administered encounter medications on file as of 01/03/2023.    Allergies (verified) Lipitor [atorvastatin calcium], Rosuvastatin calcium, Rosuvastatin calcium, Fluvastatin, Pravachol [pravastatin sodium],  Simvastatin, Other, and Statins   History: 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    Atrophy of thyroid    Coronary artery disease 2003   cabg   Depression    Hyperlipidemia    Hypertension    Hypertensive heart disease without heart failure    Moderate intellectual disabilities    Osteoporosis    Other nonrheumatic aortic valve disorders    Other specified disorders of bone density and structure, multiple sites    Presence of aortocoronary bypass graft    Presence of prosthetic heart valve    Past Surgical History:  Procedure Laterality Date   ABDOMINAL HYSTERECTOMY     BL oophorectomy   ANKLE SURGERY Left    CARDIAC VALVE REPLACEMENT     2003 (aortic valve and mitral valve replacements.)   CORONARY ARTERY BYPASS GRAFT     Family History  Problem Relation Age of Onset   Breast cancer Other    Social History   Socioeconomic History   Marital status: Single    Spouse name: Not on file   Number of children: Not on file   Years of education: Not on file   Highest education level: Not on file  Occupational History   Not on file  Tobacco Use   Smoking status: Never   Smokeless tobacco: Never  Substance and Sexual Activity   Alcohol use: Never   Drug use: Never   Sexual activity: Not Currently  Other Topics Concern   Not on file  Social History Narrative   Lives at [facility]   Social Determinants of Health   Financial Resource Strain: Low Risk  (01/03/2023)   Overall Financial Resource Strain (CARDIA)    Difficulty of Paying Living Expenses: Not hard at all  Food Insecurity: No Food Insecurity (01/03/2023)   Hunger Vital Sign    Worried About Estate manager/land agent of Food in the Last Year: Never true    Ran Out of Food in the Last Year: Never true  Transportation Needs: No Transportation Needs (01/03/2023)   PRAPARE - Armed forces logistics/support/administrative officer (Medical): No    Lack of Transportation (Non-Medical): No  Physical Activity: Inactive (01/03/2023)   Exercise Vital Sign    Days of Exercise per Week: 0 days    Minutes of Exercise per Session: 0 min  Stress: No Stress Concern Present (01/03/2023)   [place]    Feeling of Stress : Not at all  Social Connections: Socially Isolated (01/03/2023)   Social Connection and Isolation Panel [NHANES]    Frequency of Communication with Friends and Family: Three times a week    Frequency of Social Gatherings with Friends and Family: Three times a week    Attends Religious Services: Never  Active Member of Clubs or Organizations: No    Attends Archivist Meetings: Never    Marital Status: Never married    Tobacco Counseling Counseling given: Not Answered   Clinical Intake:           Nutritional Status: BMI 25 -29 Overweight Nutritional Risks: None Diabetes: No  How often do you need to have someone help you when you read instructions, pamphlets, or other written materials from your doctor or pharmacy?: [place] Needed?: No      Activities of Daily Living    01/03/2023    2:06 PM  In your present state of health, do you have any difficulty performing the following activities:  Hearing? 0  Vision? 0  Difficulty concentrating or making decisions? 1  Walking or climbing stairs? 0  Dressing or bathing? 0  Doing errands, shopping? 1  Preparing Food and eating ? Y  Using the Toilet? N  In the past six months, have you accidently leaked urine? N  Do you have problems with loss of bowel control? Y  Managing your Medications? Y  Managing your Finances? Y  Housekeeping or managing your Housekeeping? N    Patient Care Team: CoxElnita Maxwell, MD as PCP - General (Family Medicine) Flossie Buffy., MD as Referring Physician (Cardiology)  Indicate any recent  Medical Services you may have received from other than [facility] providers in the past year (date may be approximate).     Assessment:   This is a routine wellness examination for Breshawna.  Hearing/Vision screen No results found.  Dietary issues and exercise activities discussed: Current Exercise Habits: The patient does not participate in regular exercise at present, Exercise limited by: Other - see comments (Patient refuses to participate)   Goals Addressed   None   Depression Screen    01/03/2023    2:24 PM 01/03/2023    2:05 PM 08/03/2022    9:46 AM 02/26/2022    9:48 AM 07/20/2021    3:01 PM 07/20/2021    2:54 PM 02/24/2021    3:06 PM  PHQ 2/9 Scores  PHQ - 2 Score 0 0 0 0 0 0 0  PHQ- 9 Score 0    0  0    Fall Risk    01/03/2023    2:05 PM 08/03/2022    9:45 AM 02/26/2022    9:47 AM 09/21/2021    9:14 AM 07/20/2021    2:54 PM  Fall Risk   Falls in the past year? 1 0 0 1 0  Number falls in past yr: 0 0 0 0 0  Injury with Fall? 0 0 0 0 0  Risk for fall due to : Impaired balance/gait No Fall Risks  No Fall Risks No Fall Risks  Follow up Falls evaluation completed Falls evaluation completed  Falls evaluation completed Falls evaluation completed    [place]:  Any stairs in or around the home? No  If so, are there any without handrails? No  Home free of loose throw rugs in walkways, pet beds, electrical cords, etc? No  Adequate lighting in your home to reduce risk of falls? Yes   ASSISTIVE DEVICES UTILIZED TO PREVENT FALLS:  Life alert? No  Use of a cane, walker or w/c? No  Grab bars in the bathroom? Yes  Shower chair or bench in shower? Yes  Elevated toilet seat or a handicapped toilet? Yes    Cognitive  Function:        Immunizations Immunization History  Administered Date(s) Administered   Fluad Quad(high Dose 65+) 08/20/2020, 07/20/2021, 08/03/2022   Influenza Split 08/20/2020   Influenza,inj,Quad PF,6+ Mos 08/31/2019    Influenza-Unspecified 08/31/2019, 08/20/2020   Moderna SARS-COV2 Booster Vaccination 11/11/2020   Moderna Sars-Covid-2 Vaccination 12/21/2019, 01/18/2020, 11/11/2020   PPD Test 02/25/2020   Pfizer Covid-19 Vaccine Bivalent Booster 30yr & up 09/01/2021   Pneumococcal Conjugate-13 07/31/2018   Pneumococcal Polysaccharide-23 08/31/2019   Td 01/10/2016   Tdap 01/10/2016    TDAP status: Up to date  Flu Vaccine status: Up to date  Pneumococcal vaccine status: Up to date  Covid-19 vaccine status: Information provided on how to obtain vaccines.   Qualifies for Shingles Vaccine? {YES/NO:21197}  Zostavax completed {YES/NO:21197}  {Shingrix Completed?:2101804}  Screening Tests Health Maintenance  Topic Date Due   Zoster Vaccines- Shingrix (1 of 2) Never done   COVID-19 Vaccine (6 - 2023-24 season) 07/02/2022   Medicare Annual Wellness (AWV)  01/03/2024   MAMMOGRAM  04/28/2024   DTaP/Tdap/Td (3 - Td or Tdap) 01/09/2026   Pneumonia Vaccine 72 Years old  Completed   INFLUENZA VACCINE  Completed   DEXA SCAN  Completed   HPV VACCINES  Aged Out   COLONOSCOPY (Pts [phone] coverage will need to be confirmed)  Discontinued   Hepatitis C Screening  Discontinued    Health Maintenance  Health Maintenance Due  Topic Date Due   Zoster Vaccines- Shingrix (1 of 2) Never done   COVID-19 Vaccine (6 - 2023-24 season) 07/02/2022    Colorectal cancer screening: No longer required.   Mammogram status: Completed 2023. Repeat every year  Bone Density status: Completed 2023. Results reflect: Bone density results: OSTEOPOROSIS. Repeat every 2 years.  Lung Cancer Screening: (Low Dose CT Chest recommended if Age 74-80ears, 30 pack-year currently smoking OR have quit w/in 15years.) does not qualify.    Additional Screening:  Hepatitis C Screening: does not qualify;   Vision Screening: Recommended annual ophthalmology exams for early detection of glaucoma and other disorders of the  eye. Is the patient up to date with their annual eye exam?  Yes  Who is the provider or what is the name of the office in which the patient attends annual eye exams?  If pt is not established with a provider, would they like to be referred to a provider to establish care? No .   Dental Screening: Recommended annual dental exams for proper oral hygiene  Community Resource Referral / Chronic Care Management: CRR required this visit?  No   CCM required this visit?  No      Plan:     I have personally reviewed and noted the following in the patient's chart:   Medical and social history Use of alcohol, tobacco or illicit drugs  Current medications and supplements including opioid prescriptions. Patient is not currently taking opioid prescriptions. Functional ability and status Nutritional status Physical activity Advanced directives List of other physicians Hospitalizations, surgeries, and ER visits in previous 12 months Vitals Screenings to include cognitive, depression, and falls Referrals and appointments  In addition, I have reviewed and discussed with patient certain preventive protocols, quality metrics, and best practice recommendations. A written personalized care plan for preventive services as well as general preventive health recommendations were provided to patient.      I,Geralynn Ochs Leal-Borjas,acting as a scribe for KiRochel BromeMD.,have documented all relevant documentation on the behalf of KiRochel BromeMD,as directed by  Rochel Brome, MD while in the presence of Rochel Brome, MD.

## 2023-01-07 ENCOUNTER — Other Ambulatory Visit: Payer: Medicare Other

## 2023-01-07 ENCOUNTER — Other Ambulatory Visit: Payer: Self-pay

## 2023-01-07 DIAGNOSIS — E039 Hypothyroidism, unspecified: Secondary | ICD-10-CM

## 2023-01-07 DIAGNOSIS — I2510 Atherosclerotic heart disease of native coronary artery without angina pectoris: Secondary | ICD-10-CM

## 2023-01-07 DIAGNOSIS — I119 Hypertensive heart disease without heart failure: Secondary | ICD-10-CM

## 2023-01-07 DIAGNOSIS — R7303 Prediabetes: Secondary | ICD-10-CM

## 2023-01-07 DIAGNOSIS — R002 Palpitations: Secondary | ICD-10-CM

## 2023-01-08 DIAGNOSIS — Z0000 Encounter for general adult medical examination without abnormal findings: Secondary | ICD-10-CM | POA: Insufficient documentation

## 2023-01-08 LAB — CBC WITH DIFFERENTIAL/PLATELET
Basophils Absolute: 0.1 10*3/uL (ref 0.0–0.2)
Basos: 1 %
EOS (ABSOLUTE): 0.2 10*3/uL (ref 0.0–0.4)
Eos: 2 %
Hematocrit: 40.5 % (ref 34.0–46.6)
Hemoglobin: 13.9 g/dL (ref 11.1–15.9)
Immature Grans (Abs): 0 10*3/uL (ref 0.0–0.1)
Immature Granulocytes: 0 %
Lymphocytes Absolute: 1.6 10*3/uL (ref 0.7–3.1)
Lymphs: 26 %
MCH: 32.3 pg (ref 26.6–33.0)
MCHC: 34.3 g/dL (ref 31.5–35.7)
MCV: 94 fL (ref 79–97)
Monocytes Absolute: 0.7 10*3/uL (ref 0.1–0.9)
Monocytes: 11 %
Neutrophils Absolute: 3.7 10*3/uL (ref 1.4–7.0)
Neutrophils: 60 %
Platelets: 152 10*3/uL (ref 150–450)
RBC: 4.31 x10E6/uL (ref 3.77–5.28)
RDW: 11.8 % (ref 11.7–15.4)
WBC: 6.3 10*3/uL (ref 3.4–10.8)

## 2023-01-08 LAB — HEMOGLOBIN A1C
Est. average glucose Bld gHb Est-mCnc: 126 mg/dL
Hgb A1c MFr Bld: 6 % — ABNORMAL HIGH (ref 4.8–5.6)

## 2023-01-08 LAB — LIPID PANEL
Chol/HDL Ratio: 3.1 ratio (ref 0.0–4.4)
Cholesterol, Total: 171 mg/dL (ref 100–199)
HDL: 55 mg/dL (ref >39)
LDL Chol Calc (NIH): 95 mg/dL (ref 0–99)
Triglycerides: 117 mg/dL (ref 0–149)
VLDL Cholesterol Cal: 21 mg/dL (ref 5–40)

## 2023-01-08 LAB — COMPREHENSIVE METABOLIC PANEL
ALT: 64 IU/L — ABNORMAL HIGH (ref 0–32)
AST: 75 IU/L — ABNORMAL HIGH (ref 0–40)
Albumin/Globulin Ratio: 1.4 (ref 1.2–2.2)
Albumin: 4.1 g/dL (ref 3.8–4.8)
Alkaline Phosphatase: 88 IU/L (ref 44–121)
BUN/Creatinine Ratio: 14 (ref 12–28)
BUN: 12 mg/dL (ref 8–27)
Bilirubin Total: 0.4 mg/dL (ref 0.0–1.2)
CO2: 25 mmol/L (ref 20–29)
Calcium: 9.7 mg/dL (ref 8.7–10.3)
Chloride: 96 mmol/L (ref 96–106)
Creatinine, Ser: 0.85 mg/dL (ref 0.57–1.00)
Globulin, Total: 2.9 g/dL (ref 1.5–4.5)
Glucose: 118 mg/dL — ABNORMAL HIGH (ref 70–99)
Potassium: 4.6 mmol/L (ref 3.5–5.2)
Sodium: 133 mmol/L — ABNORMAL LOW (ref 134–144)
Total Protein: 7 g/dL (ref 6.0–8.5)
eGFR: 72 mL/min/{1.73_m2} (ref >59)

## 2023-01-08 LAB — MAGNESIUM: Magnesium: 2.1 mg/dL (ref 1.6–2.3)

## 2023-01-08 LAB — CARDIOVASCULAR RISK ASSESSMENT

## 2023-01-08 LAB — TSH: TSH: 3.54 u[IU]/mL (ref 0.450–4.500)

## 2023-01-08 LAB — T4, FREE: Free T4: 1.11 ng/dL (ref 0.82–1.77)

## 2023-01-08 NOTE — Progress Notes (Signed)
Blood count normal.  Liver function abnormal. Worsened. Recommend add hepatitis panel.  Kidney function normal.  Thyroid function normal. Cholesterol: normal HBA1C: 6.0.

## 2023-01-08 NOTE — Assessment & Plan Note (Signed)
Continue Toprol-XL. 

## 2023-01-08 NOTE — Assessment & Plan Note (Addendum)
Continue Prozac 20 mg 4 tablets daily.

## 2023-01-08 NOTE — Assessment & Plan Note (Signed)
Recommended continue group home. 

## 2023-01-08 NOTE — Assessment & Plan Note (Signed)
Well controlled.  Continue hctz 12.5 mg daily, losartan 50 mg once daily, toprol xl 25 mg once daily, ranexa 500 mg one bid.  Continue to work on eating a healthy diet and exercise.  Labs drawn today.   

## 2023-01-08 NOTE — Assessment & Plan Note (Signed)

## 2023-01-08 NOTE — Assessment & Plan Note (Signed)
Hemoglobin A1c 5.9%, 3 month avg of blood sugars, is in prediabetic range.  In order to prevent progression to diabetes, recommend low carb diet and regular exercise  

## 2023-01-08 NOTE — Assessment & Plan Note (Signed)
Follow-up with specialist. Continue Ranexa 500 mg twice daily, losartan 50 mg daily, metoprolol succinate 25 mg daily, Lasix 20 mg daily, and hydrochlorothiazide 12.5 mg daily.  Continue Repatha and Zetia. 

## 2023-01-08 NOTE — Assessment & Plan Note (Signed)
Previously well controlled Continue Synthroid at current dose  Recheck TSH and adjust Synthroid as indicated   

## 2023-01-09 ENCOUNTER — Encounter: Payer: Self-pay | Admitting: Family Medicine

## 2023-01-11 LAB — ACUTE VIRAL HEPATITIS (HAV, HBV, HCV)
HCV Ab: NONREACTIVE
Hep A IgM: NEGATIVE
Hep B C IgM: NEGATIVE
Hepatitis B Surface Ag: NEGATIVE

## 2023-01-11 LAB — SPECIMEN STATUS REPORT

## 2023-01-11 LAB — HCV INTERPRETATION

## 2023-01-26 ENCOUNTER — Inpatient Hospital Stay: Payer: Medicare Other | Admitting: Family Medicine

## 2023-01-26 ENCOUNTER — Telehealth: Payer: Self-pay

## 2023-01-26 NOTE — Telephone Encounter (Signed)
Sharyn Lull from [facility] called and stated that she was requesting a DC order for the Miralax that was given to patient one of the past two times at the Hospital. Patient is scheduled for a hospital follow up on 02/02/23 and informed michelle that we can't DC any medications until patient is seen by the provider. She stated that she did not know if patient was getting her day time medications due to not knowing if staff gives it to patient and then stated that she doesn't know when the patient's last bowel movement was because they state that the patient don't tell them when she has one. I informed them that they need to determine what medications the patient is taking and being given and patient may actually be having some stomach discomfort from the miralax due to it actually working because she is actually getting medication for her consitpation and it is finally moving her bowels. She stated that she will find out what medications the patient is being given and will bring that updated list to her appointment next week.

## 2023-01-29 IMAGING — MG DIGITAL DIAGNOSTIC UNILAT LEFT W/ CAD
4 series · 4 of 4 positions shown · non-contrast
Comparison: Previous exam(s).

CLINICAL DATA: Recall from 2D screening mammography, calcifications
involving the UPPER OUTER QUADRANT of the LEFT breast at MIDDLE
depth.

EXAM:
DIGITAL DIAGNOSTIC UNILATERAL LEFT MAMMOGRAM WITH CAD
TECHNIQUE: Left digital diagnostic mammography was performed. Mammographic
images were processed with CAD.

[L CC (1 of 2)]
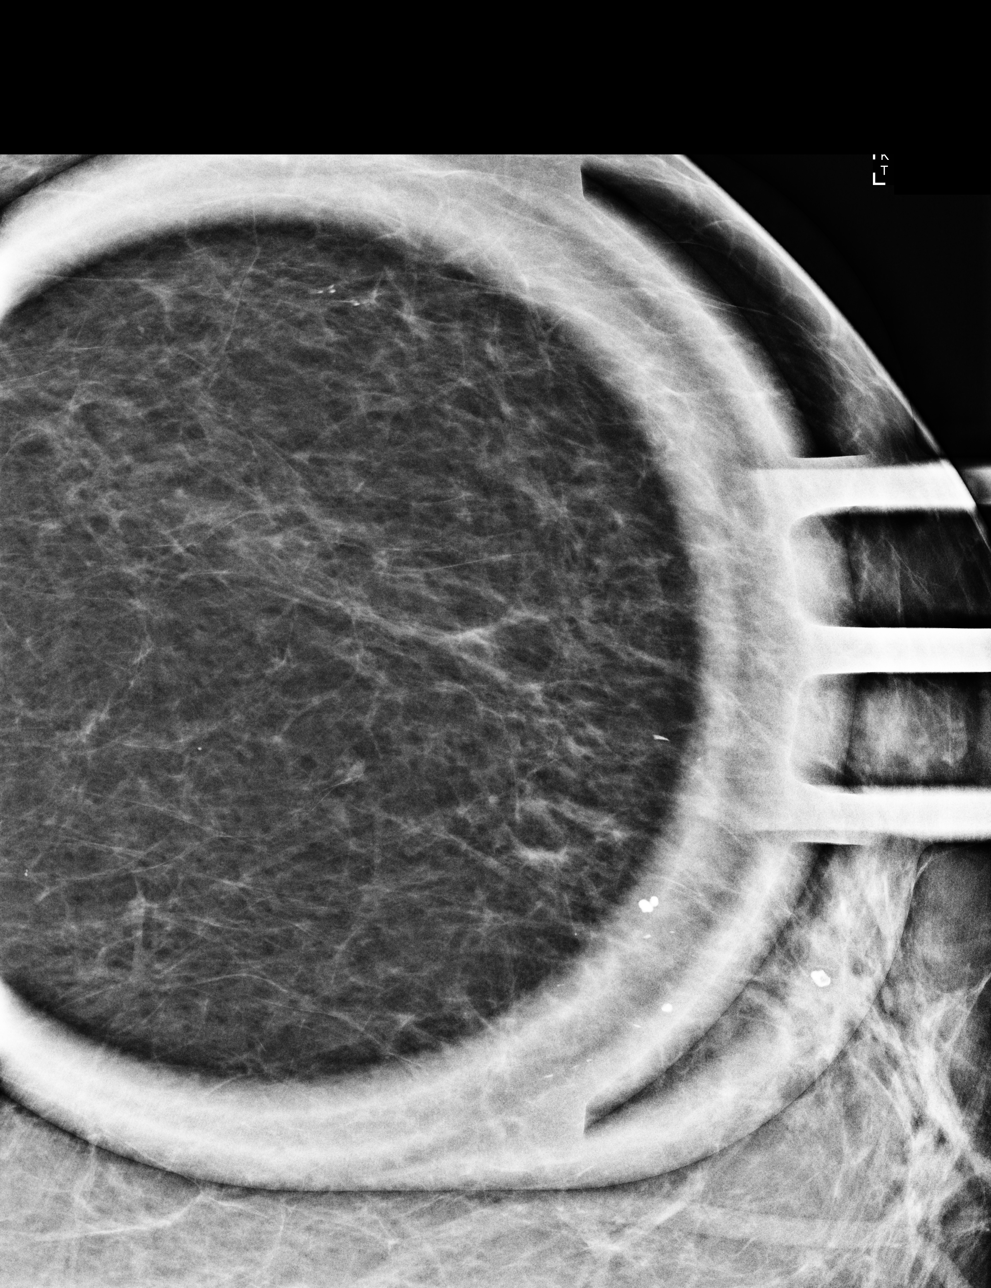

[L ML (1 of 2)]
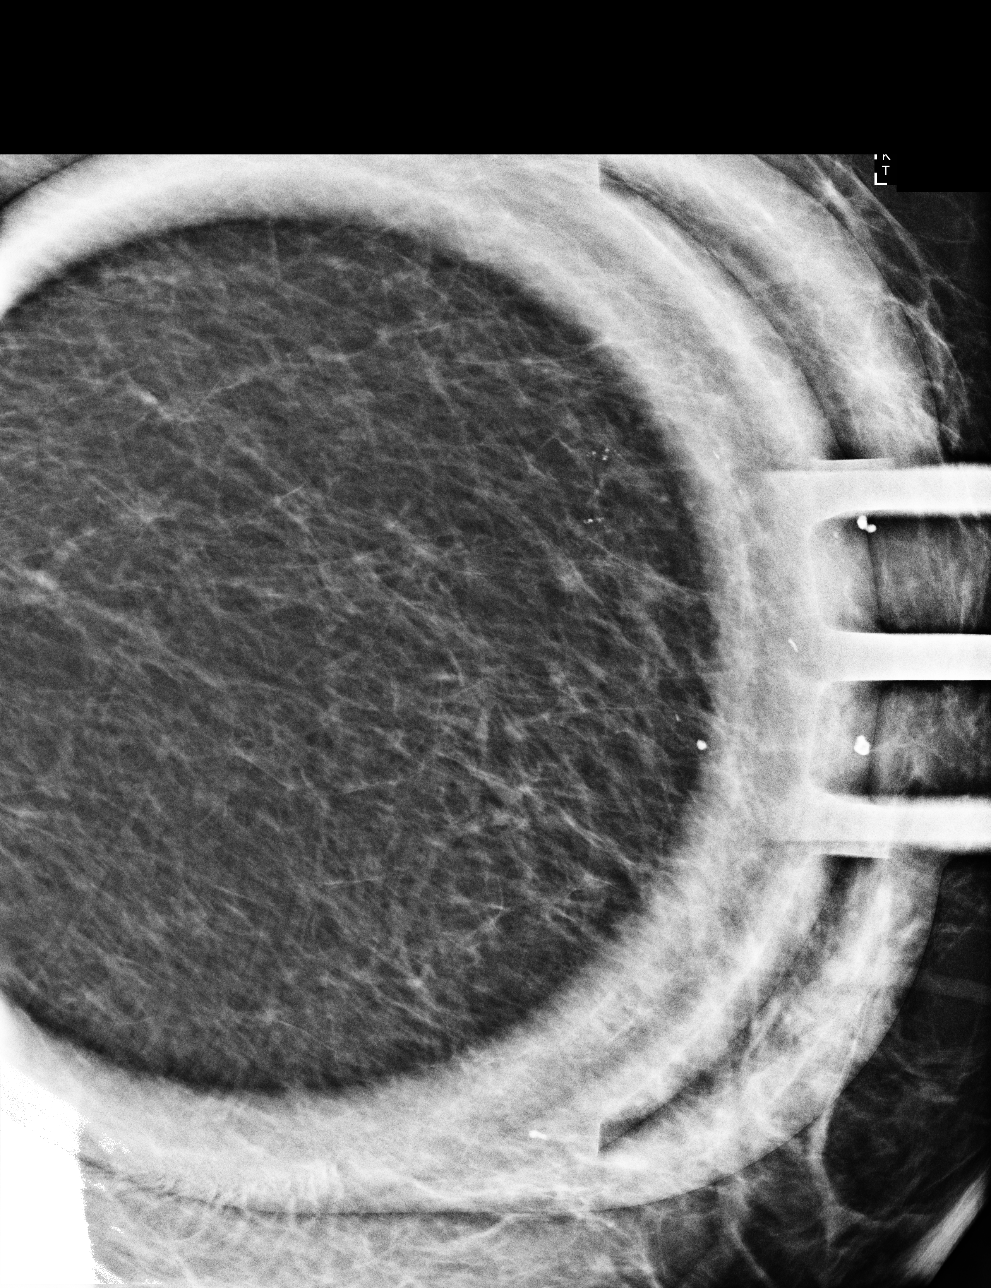

[L CC (2 of 2)]
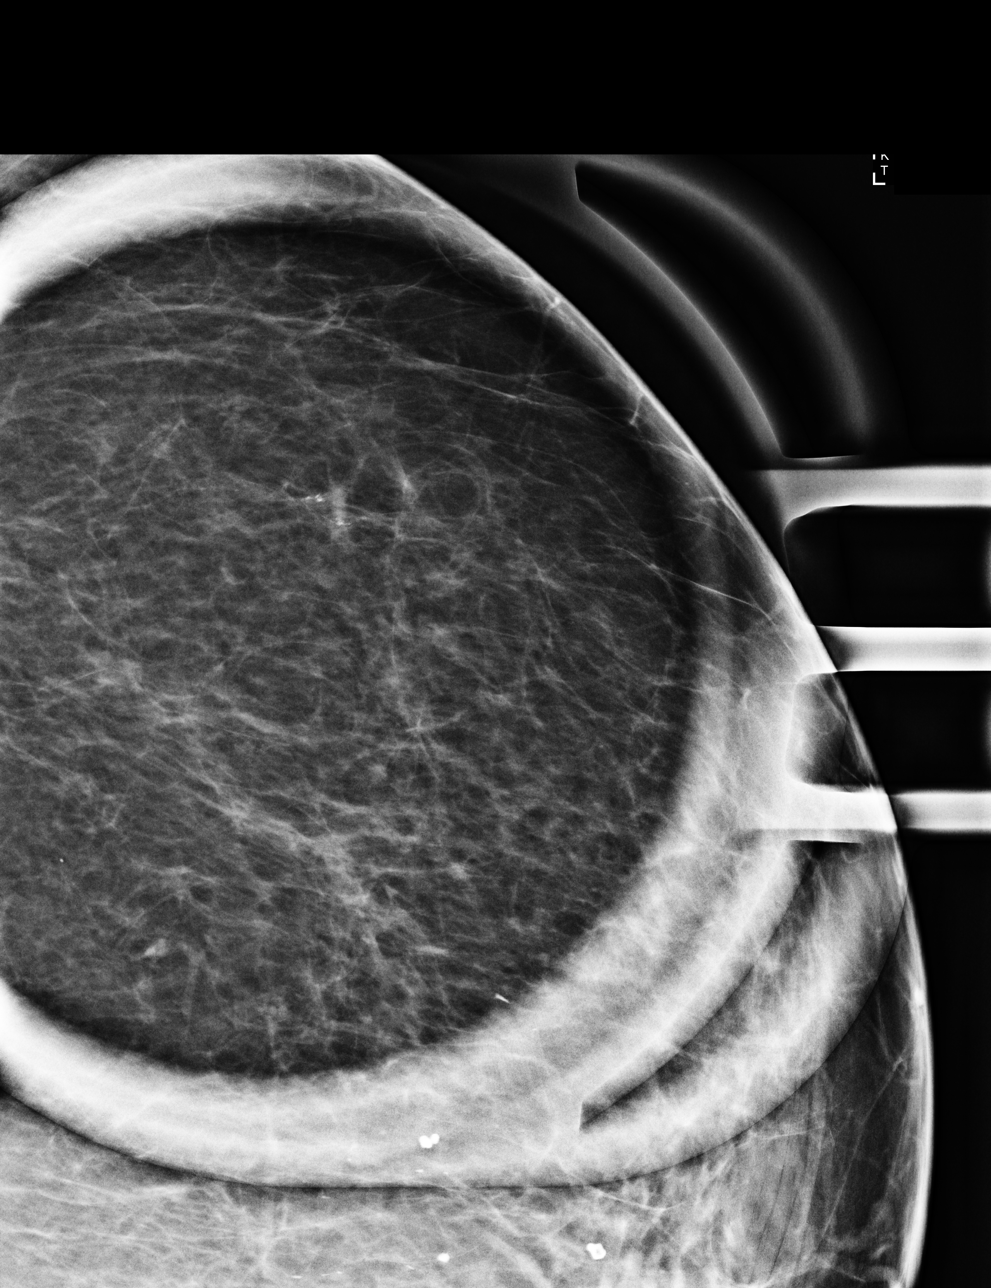

[L ML (2 of 2)]
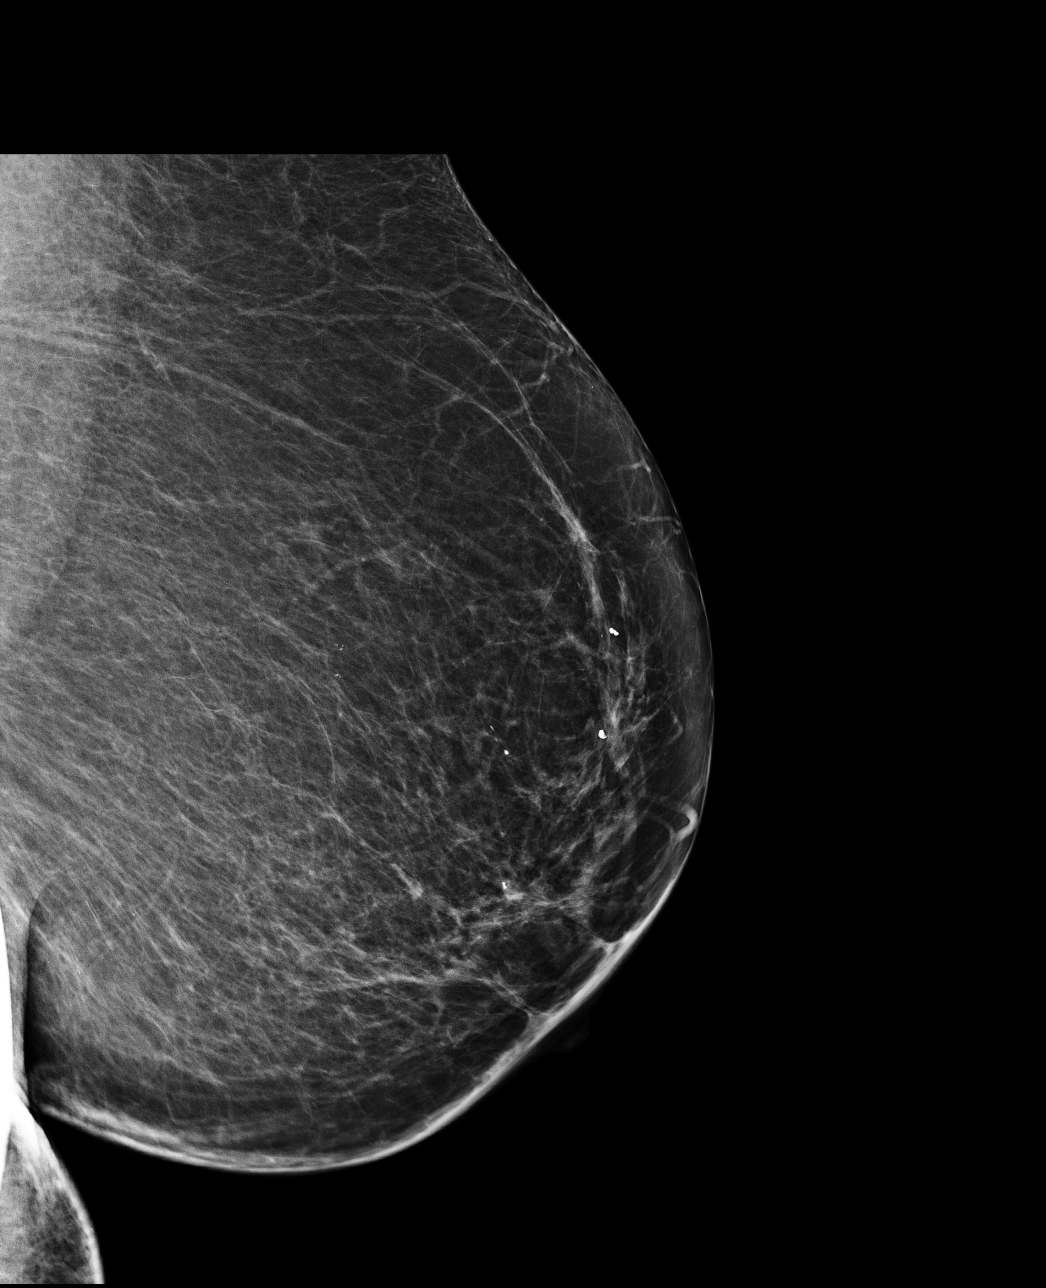

[4 of 4 positions shown; findings below may reference images not displayed]

ACR Breast Density Category b: There are scattered areas of
fibroglandular density.
FINDINGS: Spot magnification CC and mediolateral views of the LEFT breast
calcifications and a full field mediolateral view were obtained.

Spot magnification images confirm an 8 mm group of likely benign
dystrophic calcifications in the UPPER OUTER QUADRANT at middle
depth. There [DATE] adjacent smaller groups, measuring 4 mm and 3
mm, which comprise the 8 mm group and which are superimposed on the
spot magnification CC view. These calcifications were vaguely
visible on the prior mammogram in 3705 though are more conspicuous.
IMPRESSION: Likely benign calcifications involving the UPPER OUTER QUADRANT of
the LEFT breast.

RECOMMENDATION:
Diagnostic LEFT mammogram in 6 months to include spot magnification
views of the LEFT breast calcifications.

I have discussed the findings and recommendations with the patient
and her caregiver, as she is a special needs patient. If applicable,
a reminder letter will be sent to the patient regarding the next
appointment.

BI-RADS CATEGORY  3: Probably benign.

## 2023-01-30 DIAGNOSIS — R000 Tachycardia, unspecified: Secondary | ICD-10-CM

## 2023-02-02 ENCOUNTER — Ambulatory Visit (INDEPENDENT_AMBULATORY_CARE_PROVIDER_SITE_OTHER): Payer: Medicare Other | Admitting: Family Medicine

## 2023-02-02 ENCOUNTER — Encounter: Payer: Self-pay | Admitting: Family Medicine

## 2023-02-02 VITALS — BP 104/68 | HR 96 | Temp 97.5°F | Resp 14 | Ht 66.0 in | Wt 158.0 lb

## 2023-02-02 DIAGNOSIS — N3000 Acute cystitis without hematuria: Secondary | ICD-10-CM

## 2023-02-02 DIAGNOSIS — I4819 Other persistent atrial fibrillation: Secondary | ICD-10-CM

## 2023-02-02 DIAGNOSIS — E871 Hypo-osmolality and hyponatremia: Secondary | ICD-10-CM | POA: Diagnosis not present

## 2023-02-02 DIAGNOSIS — F79 Unspecified intellectual disabilities: Secondary | ICD-10-CM | POA: Diagnosis not present

## 2023-02-02 LAB — POCT URINALYSIS DIPSTICK
Bilirubin, UA: NEGATIVE
Glucose, UA: NEGATIVE
Nitrite, UA: NEGATIVE
Protein, UA: POSITIVE — AB
Spec Grav, UA: 1.015 (ref 1.010–1.025)
Urobilinogen, UA: 0.2 E.U./dL
pH, UA: 7 (ref 5.0–8.0)

## 2023-02-02 NOTE — Progress Notes (Unsigned)
Subjective:  Patient ID: Alexandra Barajas, female    DOB: 13-Mar-1951  Age: 72 y.o. MRN: [id number]  Chief Complaint  Patient presents with   Urinary Tract Infection   hyponatremia    HPI Patient was admitted on 01/29/2023 and discharged 01/30/2023 from [facility] due to Mild Dehydration with hyponatremia, recent UTI, Dementia with mental disability, abdominal pain and mildly elevated LFTs.  She was discharged to home with home health. Patient was diagnosed with UTI and they gave her Bactrim. SHE WAS TREATED FOR 4 DAYS. She was taking to the hospital for abdominal pain and fatigue.  They gave her IV fluid and she improved. They stopped Bactrim.  Patient was in and out of ED and UC prior to admission above. 3/18: HOSPITAL ED. 3/20 UC 3/23 ED 3/30 ED  She had holter monitor 8-14 days and showed Persistent atrial fibrillation-flutter with average 84 bpm. Dr Judithe Modest started her on amiodarone 200 mg daily and Eliquis 5 mg twice daily.     01/03/2023    2:24 PM 01/03/2023    2:05 PM 08/03/2022    9:46 AM 02/26/2022    9:48 AM 07/20/2021    3:01 PM  Depression screen PHQ 2/9  Decreased Interest 0 0 0 0 0  Down, Depressed, Hopeless 0 0 0 0 0  PHQ - 2 Score 0 0 0 0 0  Altered sleeping 0    0  Tired, decreased energy 0    0  Change in appetite 0    0  Feeling bad or failure about yourself  0    0  Trouble concentrating 0    0  Moving slowly or fidgety/restless 0    0  Suicidal thoughts 0    0  PHQ-9 Score 0    0  Difficult doing work/chores Not difficult at all    Not difficult at all         07/20/2021    2:54 PM 09/21/2021    9:14 AM 02/26/2022    9:47 AM 08/03/2022    9:45 AM 01/03/2023    2:05 PM  Fall Risk  Falls in the past year? 0 1 0 0 1  Was there an injury with Fall? 0 0 0 0 0  Fall Risk Category Calculator 0 1 0 0 1  Fall Risk Category (Retired) Low Low Low Low   (RETIRED) Patient Fall Risk Level Low fall risk Low fall risk Low fall risk Low fall risk   Patient at Risk  for Falls Due to No Fall Risks No Fall Risks  No Fall Risks Impaired balance/gait  Fall risk Follow up Falls evaluation completed Falls evaluation completed  Falls evaluation completed Falls evaluation completed      Review of Systems  Constitutional:  Negative for chills, fatigue and fever.  HENT:  Negative for congestion, ear pain and sore throat.   Respiratory:  Negative for cough and shortness of breath.   Cardiovascular:  Negative for chest pain and palpitations.  Gastrointestinal:  Negative for abdominal pain, constipation, diarrhea, nausea and vomiting.  Endocrine: Negative for polydipsia, polyphagia and polyuria.  Genitourinary:  Negative for difficulty urinating and dysuria.  Musculoskeletal:  Negative for arthralgias, back pain and myalgias.  Skin:  Negative for rash.  Neurological:  Negative for headaches.  Psychiatric/Behavioral:  Negative for dysphoric mood. The patient is not nervous/anxious.     Current Outpatient Medications on File Prior to Visit  Medication Sig Dispense Refill   alendronate (FOSAMAX) 70 MG  tablet Take 70 mg by mouth once a week.     amiodarone (PACERONE) 200 MG tablet Take 1 tablet by mouth daily.     apixaban (ELIQUIS) 5 MG TABS tablet Take by mouth.     chlorhexidine (PERIDEX) 0.12 % solution RINSE WITH 1 TEASPOONFUL (5 ML) EVERY MORNING 473 mL 11   Docusate Sodium (DSS) 100 MG CAPS Take 1 tablet by mouth daily. 30 capsule 11   Evolocumab (REPATHA SURECLICK) 140 MG/ML SOAJ Inject into the skin.     FLUoxetine (PROZAC) 20 MG capsule TAKE 4 CAPSULES (80 MG) BY MOUTH EVERY MORNING 120 capsule 11   hydrochlorothiazide (HYDRODIURIL) 12.5 MG tablet Take 1 tablet (12.5 mg total) by mouth daily. 30 tablet 11   lactulose (CHRONULAC) 10 GM/15ML solution TAKE 15 ML BY MOUTH ONCE DAILY 473 mL 11   lactulose, encephalopathy, (CHRONULAC) 10 GM/15ML SOLN Take 10 g by mouth. Take 30 ml (20 gram) by oral route once daily     levothyroxine (SYNTHROID) 50 MCG tablet  Take 1 tablet (50 mcg total) by mouth daily. 30 tablet 11   loratadine (CLARITIN) 10 MG tablet Take by mouth.     losartan (COZAAR) 50 MG tablet Take 50 mg by mouth daily.     metoprolol succinate (TOPROL-XL) 25 MG 24 hr tablet TAKE 1 TABLET BY MOUTH ONCE DAILY **DO NOT CRUSH* 30 tablet 11   ondansetron (ZOFRAN) 4 MG tablet Take 4 mg by mouth every 8 (eight) hours as needed.     PRALUENT 75 MG/ML SOAJ Inject 1 Syringe into the skin every 14 (fourteen) days.     ranolazine (RANEXA) 500 MG 12 hr tablet TAKE ONE TABLET BY MOUTH TWICE DAILY **DO NOT CRUSH* 60 tablet 11   Wheat Dextrin (BENEFIBER) POWD Stir 2 teaspoons of Benefiber into 4-8 oz of your favorite non-carbonated beverage or soft food (hot or cold). Stir well until dissolved. 2 teaspoons three times daily. Do not exceed 6 teaspoons per day. 730 g 11   No current facility-administered medications on file prior to visit.   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    Atrophy of thyroid    Coronary artery disease 2003   cabg   Depression    Hyperlipidemia    Hypertension    Hypertensive heart disease without heart failure    Moderate intellectual disabilities    Osteoporosis    Other nonrheumatic aortic valve disorders    Other specified disorders of bone density and structure, multiple sites    Presence of aortocoronary bypass graft    Presence of prosthetic heart valve    Past Surgical History:  Procedure Laterality Date   ABDOMINAL HYSTERECTOMY     BL oophorectomy   ANKLE SURGERY Left    CARDIAC VALVE REPLACEMENT     2003 (aortic valve and mitral valve replacements.)   CORONARY ARTERY BYPASS GRAFT      Family History  Problem Relation Age of Onset   Breast cancer Other    Social History   Socioeconomic History   Marital status: Single    Spouse name: Not on file   Number of  children: Not on file   Years of education: Not on file   Highest education level: Not on file  Occupational History   Not on file  Tobacco Use   Smoking status: Never   Smokeless  tobacco: Never  Substance and Sexual Activity   Alcohol use: Never   Drug use: Never   Sexual activity: Not Currently  Other Topics Concern   Not on file  Social History Narrative   Lives at [facility]   Social Determinants of Health   Financial Resource Strain: Low Risk  (01/03/2023)   Overall Financial Resource Strain (CARDIA)    Difficulty of Paying Living Expenses: Not hard at all  Food Insecurity: No Food Insecurity (01/03/2023)   Hunger Vital Sign    Worried About Running Out of Food in the Last Year: Never true    Ran Out of Food in the Last Year: Never true  Transportation Needs: No Transportation Needs (01/03/2023)   PRAPARE - Administrator, Civil Service (Medical): No    Lack of Transportation (Non-Medical): No  Physical Activity: Inactive (01/03/2023)   Exercise Vital Sign    Days of Exercise per Week: 0 days    Minutes of Exercise per Session: 0 min  Stress: No Stress Concern Present (01/03/2023)   [employer] of Occupational Health - Occupational Stress Questionnaire    Feeling of Stress : Not at all  Social Connections: Socially Isolated (01/03/2023)   Social Connection and Isolation Panel [NHANES]    Frequency of Communication with Friends and Family: Three times a week    Frequency of Social Gatherings with Friends and Family: Three times a week    Attends Religious Services: Never    Active Member of Clubs or Organizations: No    Attends Banker Meetings: Never    Marital Status: Never married    Objective:  BP 104/68   Pulse 96   Temp (!) 97.5 F (36.4 C)   Resp 14   Ht 5\' 6"  (1.676 m)   Wt 158 lb (71.7 kg)   SpO2 99%   BMI 25.50 kg/m      02/02/2023    1:48 PM 01/03/2023    2:01 PM 08/03/2022    9:44 AM  BP/Weight  Systolic BP 104 128 110   Diastolic BP 68 72 60  Wt. (Lbs) 158 164 166  BMI 25.5 kg/m2 26.47 kg/m2 26.79 kg/m2    Physical Exam Vitals reviewed.  Constitutional:      Appearance: Normal appearance.  Neck:     Vascular: No carotid bruit.  Cardiovascular:     Rate and Rhythm: Normal rate and regular rhythm.     Pulses: Normal pulses.     Heart sounds: Normal heart sounds.  Pulmonary:     Effort: Pulmonary effort is normal.     Breath sounds: Normal breath sounds.  Abdominal:     General: Bowel sounds are normal.     Palpations: Abdomen is soft.     Tenderness: There is no abdominal tenderness.  Neurological:     Mental Status: She is alert and oriented to person, place, and time.  Psychiatric:        Mood and Affect: Mood normal.        Behavior: Behavior normal.     Diabetic Foot Exam - Simple   No data filed      Lab Results  Component Value Date   WBC 7.2 02/02/2023   HGB 15.1 02/02/2023   HCT 44.1 02/02/2023   PLT 162 02/02/2023   GLUCOSE 109 (H) 02/02/2023   CHOL 171 01/07/2023   TRIG 117 01/07/2023   HDL 55 01/07/2023   LDLCALC 95 01/07/2023   ALT 174 (H)  02/02/2023   AST 147 (H) 02/02/2023   NA 134 02/02/2023   K 4.1 02/02/2023   CL 91 (L) 02/02/2023   CREATININE 0.82 02/02/2023   BUN 10 02/02/2023   CO2 28 02/02/2023   TSH 3.540 01/07/2023   HGBA1C 6.0 (H) 01/07/2023      Assessment & Plan:    Hyponatremia Assessment & Plan: Check labs.  Orders: -     Comprehensive metabolic panel  Mental disability Assessment & Plan: Recommended continue group home.   Persistent atrial fibrillation Assessment & Plan: Management per specialist. Continue with amiodarone 200 mg daily and Eliquis 5 mg.   Orders: -     Comprehensive metabolic panel -     CBC with Differential/Platelet  Acute cystitis without hematuria Assessment & Plan: Ordered UA and Urine culture.  Orders: -     POCT urinalysis dipstick -     Urine Culture     No orders of the defined types  were placed in this encounter.   Orders Placed This Encounter  Procedures   Urine Culture   Comprehensive metabolic panel   CBC with Differential/Platelet   POCT urinalysis dipstick     Follow-up: No follow-ups on file.   I,Marla I Leal-Borjas,acting as a scribe for Blane Ohara, MD.,have documented all relevant documentation on the behalf of Blane Ohara, MD,as directed by  Blane Ohara, MD while in the presence of Blane Ohara, MD.   An After Visit Summary was printed and given to the patient.  I attest that I have reviewed this visit and agree with the plan scribed by my staff.   Blane Ohara, MD [facility] [phone]

## 2023-02-03 LAB — COMPREHENSIVE METABOLIC PANEL
ALT: 174 IU/L — ABNORMAL HIGH (ref 0–32)
AST: 147 IU/L — ABNORMAL HIGH (ref 0–40)
Albumin/Globulin Ratio: 1.6 (ref 1.2–2.2)
Albumin: 4.5 g/dL (ref 3.8–4.8)
Alkaline Phosphatase: 87 IU/L (ref 44–121)
BUN/Creatinine Ratio: 12 (ref 12–28)
BUN: 10 mg/dL (ref 8–27)
Bilirubin Total: 0.5 mg/dL (ref 0.0–1.2)
CO2: 28 mmol/L (ref 20–29)
Calcium: 10.1 mg/dL (ref 8.7–10.3)
Chloride: 91 mmol/L — ABNORMAL LOW (ref 96–106)
Creatinine, Ser: 0.82 mg/dL (ref 0.57–1.00)
Globulin, Total: 2.9 g/dL (ref 1.5–4.5)
Glucose: 109 mg/dL — ABNORMAL HIGH (ref 70–99)
Potassium: 4.1 mmol/L (ref 3.5–5.2)
Sodium: 134 mmol/L (ref 134–144)
Total Protein: 7.4 g/dL (ref 6.0–8.5)
eGFR: 75 mL/min/{1.73_m2} (ref >59)

## 2023-02-03 LAB — CBC WITH DIFFERENTIAL/PLATELET
Basophils Absolute: 0 10*3/uL (ref 0.0–0.2)
Basos: 1 %
EOS (ABSOLUTE): 0.2 10*3/uL (ref 0.0–0.4)
Eos: 2 %
Hematocrit: 44.1 % (ref 34.0–46.6)
Hemoglobin: 15.1 g/dL (ref 11.1–15.9)
Immature Grans (Abs): 0 10*3/uL (ref 0.0–0.1)
Immature Granulocytes: 0 %
Lymphocytes Absolute: 2.7 10*3/uL (ref 0.7–3.1)
Lymphs: 38 %
MCH: 32.3 pg (ref 26.6–33.0)
MCHC: 34.2 g/dL (ref 31.5–35.7)
MCV: 94 fL (ref 79–97)
Monocytes Absolute: 0.8 10*3/uL (ref 0.1–0.9)
Monocytes: 11 %
Neutrophils Absolute: 3.5 10*3/uL (ref 1.4–7.0)
Neutrophils: 48 %
Platelets: 162 10*3/uL (ref 150–450)
RBC: 4.68 x10E6/uL (ref 3.77–5.28)
RDW: 12 % (ref 11.7–15.4)
WBC: 7.2 10*3/uL (ref 3.4–10.8)

## 2023-02-04 ENCOUNTER — Other Ambulatory Visit: Payer: Self-pay

## 2023-02-04 DIAGNOSIS — R7989 Other specified abnormal findings of blood chemistry: Secondary | ICD-10-CM

## 2023-02-04 LAB — URINE CULTURE

## 2023-02-04 NOTE — Progress Notes (Signed)
Blood count normal.  Liver function abnormal. Worsened.  No tylenol. Kidney function normal.  Repeat in 6 days. Dr. Sedalia Muta

## 2023-02-05 DIAGNOSIS — E871 Hypo-osmolality and hyponatremia: Secondary | ICD-10-CM | POA: Insufficient documentation

## 2023-02-05 DIAGNOSIS — N3001 Acute cystitis with hematuria: Secondary | ICD-10-CM | POA: Insufficient documentation

## 2023-02-05 DIAGNOSIS — N3000 Acute cystitis without hematuria: Secondary | ICD-10-CM | POA: Insufficient documentation

## 2023-02-05 NOTE — Assessment & Plan Note (Signed)
Management per specialist. Continue with amiodarone 200 mg daily and Eliquis 5 mg.

## 2023-02-05 NOTE — Assessment & Plan Note (Signed)
Recommended continue group home. 

## 2023-02-05 NOTE — Assessment & Plan Note (Signed)
Ordered UA and Urine culture. 

## 2023-02-05 NOTE — Assessment & Plan Note (Signed)
Check labs 

## 2023-02-06 DIAGNOSIS — N390 Urinary tract infection, site not specified: Secondary | ICD-10-CM | POA: Insufficient documentation

## 2023-02-06 NOTE — Assessment & Plan Note (Signed)
Sent for urine culture.  No antibiotics at this time.

## 2023-02-08 ENCOUNTER — Ambulatory Visit: Payer: Medicare Other | Admitting: Family Medicine

## 2023-02-10 ENCOUNTER — Ambulatory Visit (INDEPENDENT_AMBULATORY_CARE_PROVIDER_SITE_OTHER): Payer: Medicare Other | Admitting: Family Medicine

## 2023-02-10 VITALS — BP 110/72 | HR 86 | Temp 97.5°F | Ht 66.0 in | Wt 157.4 lb

## 2023-02-10 DIAGNOSIS — K5904 Chronic idiopathic constipation: Secondary | ICD-10-CM | POA: Insufficient documentation

## 2023-02-10 DIAGNOSIS — K5900 Constipation, unspecified: Secondary | ICD-10-CM | POA: Diagnosis not present

## 2023-02-10 DIAGNOSIS — R7989 Other specified abnormal findings of blood chemistry: Secondary | ICD-10-CM

## 2023-02-10 MED ORDER — LINACLOTIDE 145 MCG PO CAPS
145.0000 ug | ORAL_CAPSULE | Freq: Every day | ORAL | 3 refills | Status: DC
Start: 2023-02-10 — End: 2023-03-01

## 2023-02-10 NOTE — Assessment & Plan Note (Addendum)
Start Linzess 145 mg Daily Give extra dose of Miralax 17 gm tonight Stop miralax and dulcolax once patient start linzess. Continue fiber supplement once daily.

## 2023-02-10 NOTE — Progress Notes (Signed)
Acute Office Visit  Subjective:    Patient ID: Alexandra Barajas, female    DOB: 04-23-1951, 72 y.o.   MRN: [id number]  Chief Complaint  Patient presents with   Constipation    HPI: Patient is in today for with complaints of constipation. Patient is here today with her caregiver. Caregiver states she has not had a bowel movement in a couple days. She is taking dulcolax once daily, miralax once daily and a fiber powder supplement at lunch time.   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    Atrophy of thyroid    Coronary artery disease 2003   cabg   Depression    Hyperlipidemia    Hypertension    Hypertensive heart disease without heart failure    Moderate intellectual disabilities    Osteoporosis    Other nonrheumatic aortic valve disorders    Other specified disorders of bone density and structure, multiple sites    Presence of aortocoronary bypass graft    Presence of prosthetic heart valve     Past Surgical History:  Procedure Laterality Date   ABDOMINAL HYSTERECTOMY     BL oophorectomy   ANKLE SURGERY Left    CARDIAC VALVE REPLACEMENT     2003 (aortic valve and mitral valve replacements.)   CORONARY ARTERY BYPASS GRAFT      Family History  Problem Relation Age of Onset   Breast cancer Other     Social History   Socioeconomic History   Marital status: Single    Spouse name: Not on file   Number of children: Not on file   Years of education: Not on file   Highest education level: Not on file  Occupational History   Not on file  Tobacco Use   Smoking status: Never   Smokeless tobacco: Never  Substance and Sexual Activity   Alcohol use: Never   Drug use: Never   Sexual activity: Not Currently  Other Topics Concern   Not on file  Social History Narrative   Lives at [facility]   Social Determinants of Health    Financial Resource Strain: Low Risk  (01/03/2023)   Overall Financial Resource Strain (CARDIA)    Difficulty of Paying Living Expenses: Not hard at all  Food Insecurity: No Food Insecurity (01/03/2023)   Hunger Vital Sign    Worried About Running Out of Food in the Last Year: Never true    Ran Out of Food in the Last Year: Never true  Transportation Needs: No Transportation Needs (01/03/2023)   PRAPARE - Administrator, Civil Service (Medical): No    Lack of Transportation (Non-Medical): No  Physical Activity: Inactive (01/03/2023)   Exercise Vital Sign    Days of Exercise per Week: 0 days    Minutes of Exercise per Session: 0 min  Stress: No Stress Concern Present (01/03/2023)   [employer] of Occupational Health - Occupational Stress Questionnaire    Feeling of Stress : Not at all  Social Connections: Socially Isolated (01/03/2023)   Social Connection and Isolation Panel [NHANES]    Frequency of Communication with Friends and Family: Three times a week    Frequency of Social Gatherings with Friends and Family: Three times a week    Attends Religious Services: Never    Active Member of Clubs or Organizations: No    Attends  Club or Organization Meetings: Never    Marital Status: Never married  Intimate Partner Violence: Not At Risk (01/03/2023)   Humiliation, Afraid, Rape, and Kick questionnaire    Fear of Current or Ex-Partner: No    Emotionally Abused: No    Physically Abused: No    Sexually Abused: No    Outpatient Medications Prior to Visit  Medication Sig Dispense Refill   alendronate (FOSAMAX) 70 MG tablet Take 70 mg by mouth once a week.     amiodarone (PACERONE) 200 MG tablet Take 1 tablet by mouth daily.     apixaban (ELIQUIS) 5 MG TABS tablet Take by mouth.     chlorhexidine (PERIDEX) 0.12 % solution RINSE WITH 1 TEASPOONFUL (5 ML) EVERY MORNING 473 mL 11   Docusate Sodium (DSS) 100 MG CAPS Take 1 tablet by mouth daily. 30 capsule 11   Evolocumab (REPATHA  SURECLICK) 140 MG/ML SOAJ Inject into the skin.     FLUoxetine (PROZAC) 20 MG capsule TAKE 4 CAPSULES (80 MG) BY MOUTH EVERY MORNING 120 capsule 11   hydrochlorothiazide (HYDRODIURIL) 12.5 MG tablet Take 1 tablet (12.5 mg total) by mouth daily. 30 tablet 11   lactulose (CHRONULAC) 10 GM/15ML solution TAKE 15 ML BY MOUTH ONCE DAILY 473 mL 11   lactulose, encephalopathy, (CHRONULAC) 10 GM/15ML SOLN Take 10 g by mouth. Take 30 ml (20 gram) by oral route once daily     levothyroxine (SYNTHROID) 50 MCG tablet Take 1 tablet (50 mcg total) by mouth daily. 30 tablet 11   loratadine (CLARITIN) 10 MG tablet Take by mouth.     losartan (COZAAR) 50 MG tablet Take 50 mg by mouth daily.     metoprolol succinate (TOPROL-XL) 25 MG 24 hr tablet TAKE 1 TABLET BY MOUTH ONCE DAILY **DO NOT CRUSH* 30 tablet 11   omeprazole (PRILOSEC) 40 MG capsule Take 40 mg by mouth daily.     ondansetron (ZOFRAN) 4 MG tablet Take 4 mg by mouth every 8 (eight) hours as needed.     PRALUENT 75 MG/ML SOAJ Inject 1 Syringe into the skin every 14 (fourteen) days.     ranolazine (RANEXA) 500 MG 12 hr tablet TAKE ONE TABLET BY MOUTH TWICE DAILY **DO NOT CRUSH* 60 tablet 11   Wheat Dextrin (BENEFIBER) POWD Stir 2 teaspoons of Benefiber into 4-8 oz of your favorite non-carbonated beverage or soft food (hot or cold). Stir well until dissolved. 2 teaspoons three times daily. Do not exceed 6 teaspoons per day. 730 g 11   No facility-administered medications prior to visit.    Allergies  Allergen Reactions   Lipitor [Atorvastatin Calcium] Other (See Comments)    ELEVATED LIVER ENZYMES   Rosuvastatin Calcium Other (See Comments)    ELEVATED LIVER ENZYMES ELEVATED LIVER ENZYMES   Rosuvastatin Calcium Other (See Comments)    ELEVATED LIVER ENZYMES ELEVATED LIVER ENZYMES   Fluvastatin Other (See Comments)    MYALGIA MYALGIA MYALGIA MYALGIA   Pravachol [Pravastatin Sodium] Other (See Comments)    MYALGIA   Simvastatin Other (See  Comments)    MYALGIA MYALGIA unknown unknown MYALGIA MYALGIA unknown   Other Other (See Comments)    Reductase Inhibitors - unknown   Statins     Review of Systems  Constitutional:  Negative for fatigue.  HENT:  Negative for congestion, ear pain and sore throat.   Respiratory:  Negative for cough and shortness of breath.   Cardiovascular:  Negative for chest pain.  Gastrointestinal:  Positive for constipation. Negative  for abdominal pain, diarrhea, nausea and vomiting.  Genitourinary:  Negative for dysuria, frequency and urgency.  Musculoskeletal:  Negative for arthralgias, back pain and myalgias.  Neurological:  Negative for dizziness and headaches.  Psychiatric/Behavioral:  Negative for agitation and sleep disturbance. The patient is not nervous/anxious.        Objective:        02/10/2023    2:12 PM 02/02/2023    1:48 PM 01/03/2023    2:01 PM  Vitals with BMI  Height 5\' 6"  5\' 6"  5\' 6"   Weight 157 lbs 6 oz 158 lbs 164 lbs  BMI 25.42 25.51 26.48  Systolic 110 104 161  Diastolic 72 68 72  Pulse 86 96 88    No data found.    Physical Exam Vitals reviewed.  Constitutional:      Appearance: Normal appearance. She is normal weight.  Neck:     Vascular: No carotid bruit.  Cardiovascular:     Rate and Rhythm: Normal rate and regular rhythm.     Heart sounds: Normal heart sounds.  Pulmonary:     Effort: Pulmonary effort is normal.     Breath sounds: Normal breath sounds.  Abdominal:     General: Abdomen is flat. Bowel sounds are normal.     Palpations: Abdomen is soft.     Tenderness: There is no abdominal tenderness.  Neurological:     Mental Status: She is alert and oriented to person, place, and time.  Psychiatric:        Mood and Affect: Mood normal.        Behavior: Behavior normal.     Health Maintenance Due  Topic Date Due   Zoster Vaccines- Shingrix (1 of 2) Never done   COVID-19 Vaccine (6 - 2023-24 season) 07/02/2022    There are no preventive  care reminders to display for this patient.   Lab Results  Component Value Date   TSH 3.540 01/07/2023   Lab Results  Component Value Date   WBC 7.2 02/02/2023   HGB 15.1 02/02/2023   HCT 44.1 02/02/2023   MCV 94 02/02/2023   PLT 162 02/02/2023   Lab Results  Component Value Date   NA 133 (L) 02/10/2023   K 4.1 02/10/2023   CO2 27 02/10/2023   GLUCOSE 123 (H) 02/10/2023   BUN 12 02/10/2023   CREATININE 0.83 02/10/2023   BILITOT 0.4 02/10/2023   ALKPHOS 83 02/10/2023   AST 64 (H) 02/10/2023   ALT 78 (H) 02/10/2023   PROT 7.3 02/10/2023   ALBUMIN 4.4 02/10/2023   CALCIUM 9.7 02/10/2023   EGFR 74 02/10/2023   Lab Results  Component Value Date   CHOL 171 01/07/2023   Lab Results  Component Value Date   HDL 55 01/07/2023   Lab Results  Component Value Date   LDLCALC 95 01/07/2023   Lab Results  Component Value Date   TRIG 117 01/07/2023   Lab Results  Component Value Date   CHOLHDL 3.1 01/07/2023   Lab Results  Component Value Date   HGBA1C 6.0 (H) 01/07/2023       Assessment & Plan:  Constipation, unspecified constipation type Assessment & Plan: Start Linzess 145 mg Daily Give extra dose of Miralax 17 gm tonight Stop miralax and dulcolax once patient start linzess. Continue fiber supplement once daily.  Orders: -     linaCLOtide; Take 1 capsule (145 mcg total) by mouth daily before breakfast.  Dispense: 30 capsule; Refill: 3  Abnormal liver  function test -     Comprehensive metabolic panel     Meds ordered this encounter  Medications   linaclotide (LINZESS) 145 MCG CAPS capsule    Sig: Take 1 capsule (145 mcg total) by mouth daily before breakfast.    Dispense:  30 capsule    Refill:  3    No orders of the defined types were placed in this encounter.    Follow-up: Return in about 3 months (around 05/12/2023) for chronic fasting.  An After Visit Summary was printed and given to the patient.  Blane Ohara, MD [facility] [phone]

## 2023-02-11 LAB — COMPREHENSIVE METABOLIC PANEL
ALT: 78 IU/L — ABNORMAL HIGH (ref 0–32)
AST: 64 IU/L — ABNORMAL HIGH (ref 0–40)
Albumin/Globulin Ratio: 1.5 (ref 1.2–2.2)
Albumin: 4.4 g/dL (ref 3.8–4.8)
Alkaline Phosphatase: 83 IU/L (ref 44–121)
BUN/Creatinine Ratio: 14 (ref 12–28)
BUN: 12 mg/dL (ref 8–27)
Bilirubin Total: 0.4 mg/dL (ref 0.0–1.2)
CO2: 27 mmol/L (ref 20–29)
Calcium: 9.7 mg/dL (ref 8.7–10.3)
Chloride: 90 mmol/L — ABNORMAL LOW (ref 96–106)
Creatinine, Ser: 0.83 mg/dL (ref 0.57–1.00)
Globulin, Total: 2.9 g/dL (ref 1.5–4.5)
Glucose: 123 mg/dL — ABNORMAL HIGH (ref 70–99)
Potassium: 4.1 mmol/L (ref 3.5–5.2)
Sodium: 133 mmol/L — ABNORMAL LOW (ref 134–144)
Total Protein: 7.3 g/dL (ref 6.0–8.5)
eGFR: 74 mL/min/{1.73_m2} (ref >59)

## 2023-02-13 ENCOUNTER — Encounter: Payer: Self-pay | Admitting: Family Medicine

## 2023-03-01 ENCOUNTER — Ambulatory Visit (INDEPENDENT_AMBULATORY_CARE_PROVIDER_SITE_OTHER): Payer: Medicare Other | Admitting: Family Medicine

## 2023-03-01 ENCOUNTER — Encounter: Payer: Self-pay | Admitting: Family Medicine

## 2023-03-01 VITALS — BP 94/62 | HR 88 | Temp 97.2°F | Resp 16 | Ht 66.0 in | Wt 157.0 lb

## 2023-03-01 DIAGNOSIS — N952 Postmenopausal atrophic vaginitis: Secondary | ICD-10-CM

## 2023-03-01 DIAGNOSIS — K5904 Chronic idiopathic constipation: Secondary | ICD-10-CM

## 2023-03-01 DIAGNOSIS — N760 Acute vaginitis: Secondary | ICD-10-CM

## 2023-03-01 DIAGNOSIS — L308 Other specified dermatitis: Secondary | ICD-10-CM | POA: Diagnosis not present

## 2023-03-01 MED ORDER — LINACLOTIDE 72 MCG PO CAPS
72.0000 ug | ORAL_CAPSULE | Freq: Every day | ORAL | 5 refills | Status: DC
Start: 2023-03-01 — End: 2023-03-16

## 2023-03-01 MED ORDER — ESTRADIOL 0.1 MG/GM VA CREA
TOPICAL_CREAM | VAGINAL | 12 refills | Status: DC
Start: 2023-03-01 — End: 2023-09-15

## 2023-03-01 MED ORDER — TRIAMCINOLONE ACETONIDE 0.1 % EX CREA
TOPICAL_CREAM | CUTANEOUS | 0 refills | Status: DC
Start: 2023-03-01 — End: 2023-07-26

## 2023-03-01 NOTE — Progress Notes (Unsigned)
Acute Office Visit  Subjective:    Patient ID: Alexandra Barajas, female    DOB: [birth year], 72 y.o.   MRN: [id number]  Chief Complaint  Patient presents with   Vaginal Itching   HPI: Patient is in today for vaginal itching x 2 weeks, bleeding from scratching, no vaginal discharge. Prediabetic. Showers daily, uses dial antibacterial bar soap.   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    Atrophy of thyroid    Coronary artery disease 2003   cabg   Depression    Hyperlipidemia    Hypertension    Hypertensive heart disease without heart failure    Moderate intellectual disabilities    Osteoporosis    Other nonrheumatic aortic valve disorders    Other specified disorders of bone density and structure, multiple sites    Presence of aortocoronary bypass graft    Presence of prosthetic heart valve     Past Surgical History:  Procedure Laterality Date   ABDOMINAL HYSTERECTOMY     BL oophorectomy   ANKLE SURGERY Left    CARDIAC VALVE REPLACEMENT     2003 (aortic valve and mitral valve replacements.)   CORONARY ARTERY BYPASS GRAFT      Family History  Problem Relation Age of Onset   Breast cancer Other     Social History   Socioeconomic History   Marital status: Single    Spouse name: Not on file   Number of children: Not on file   Years of education: Not on file   Highest education level: Not on file  Occupational History   Not on file  Tobacco Use   Smoking status: Never   Smokeless tobacco: Never  Substance and Sexual Activity   Alcohol use: Never   Drug use: Never   Sexual activity: Not Currently  Other Topics Concern   Not on file  Social History Narrative   Lives at [facility]   Social Determinants of Health   Financial Resource Strain: Low Risk  (01/03/2023)   Overall Financial Resource Strain (CARDIA)    Difficulty  of Paying Living Expenses: Not hard at all  Food Insecurity: No Food Insecurity (01/03/2023)   Hunger Vital Sign    Worried About Running Out of Food in the Last Year: Never true    Ran Out of Food in the Last Year: Never true  Transportation Needs: No Transportation Needs (01/03/2023)   PRAPARE - Administrator, Civil Service (Medical): No    Lack of Transportation (Non-Medical): No  Physical Activity: Inactive (01/03/2023)   Exercise Vital Sign    Days of Exercise per Week: 0 days    Minutes of Exercise per Session: 0 min  Stress: No Stress Concern Present (01/03/2023)   [employer] of Occupational Health - Occupational Stress Questionnaire    Feeling of Stress : Not at all  Social Connections: Socially Isolated (01/03/2023)   Social Connection and Isolation Panel [NHANES]    Frequency of Communication with Friends and Family: Three times a week    Frequency of Social Gatherings with Friends and Family: Three times a week    Attends Religious Services: Never    Active Member of Clubs or Organizations: No    Attends Banker Meetings: Never    Marital Status: Never married  Intimate Partner Violence: Not At Risk (01/03/2023)  Humiliation, Afraid, Rape, and Kick questionnaire    Fear of Current or Ex-Partner: No    Emotionally Abused: No    Physically Abused: No    Sexually Abused: No    Outpatient Medications Prior to Visit  Medication Sig Dispense Refill   alendronate (FOSAMAX) 70 MG tablet Take 70 mg by mouth once a week.     amiodarone (PACERONE) 200 MG tablet Take 1 tablet by mouth daily.     apixaban (ELIQUIS) 5 MG TABS tablet Take by mouth.     chlorhexidine (PERIDEX) 0.12 % solution RINSE WITH 1 TEASPOONFUL (5 ML) EVERY MORNING 473 mL 11   Docusate Sodium (DSS) 100 MG CAPS Take 1 tablet by mouth daily. 30 capsule 11   Evolocumab (REPATHA SURECLICK) 140 MG/ML SOAJ Inject into the skin.     FLUoxetine (PROZAC) 20 MG capsule TAKE 4 CAPSULES (80 MG) BY  MOUTH EVERY MORNING 120 capsule 11   hydrochlorothiazide (HYDRODIURIL) 12.5 MG tablet Take 1 tablet (12.5 mg total) by mouth daily. 30 tablet 11   lactulose (CHRONULAC) 10 GM/15ML solution TAKE 15 ML BY MOUTH ONCE DAILY 473 mL 11   lactulose, encephalopathy, (CHRONULAC) 10 GM/15ML SOLN Take 10 g by mouth. Take 30 ml (20 gram) by oral route once daily     levothyroxine (SYNTHROID) 50 MCG tablet Take 1 tablet (50 mcg total) by mouth daily. 30 tablet 11   loratadine (CLARITIN) 10 MG tablet Take by mouth.     losartan (COZAAR) 50 MG tablet Take 50 mg by mouth daily.     metoprolol succinate (TOPROL-XL) 25 MG 24 hr tablet TAKE 1 TABLET BY MOUTH ONCE DAILY **DO NOT CRUSH* 30 tablet 11   omeprazole (PRILOSEC) 40 MG capsule Take 40 mg by mouth daily.     ondansetron (ZOFRAN) 4 MG tablet Take 4 mg by mouth every 8 (eight) hours as needed.     PRALUENT 75 MG/ML SOAJ Inject 1 Syringe into the skin every 14 (fourteen) days.     ranolazine (RANEXA) 500 MG 12 hr tablet TAKE ONE TABLET BY MOUTH TWICE DAILY **DO NOT CRUSH* 60 tablet 11   Wheat Dextrin (BENEFIBER) POWD Stir 2 teaspoons of Benefiber into 4-8 oz of your favorite non-carbonated beverage or soft food (hot or cold). Stir well until dissolved. 2 teaspoons three times daily. Do not exceed 6 teaspoons per day. 730 g 11   linaclotide (LINZESS) 145 MCG CAPS capsule Take 1 capsule (145 mcg total) by mouth daily before breakfast. 30 capsule 3   No facility-administered medications prior to visit.    Allergies  Allergen Reactions   Lipitor [Atorvastatin Calcium] Other (See Comments)    ELEVATED LIVER ENZYMES   Rosuvastatin Calcium Other (See Comments)    ELEVATED LIVER ENZYMES ELEVATED LIVER ENZYMES   Rosuvastatin Calcium Other (See Comments)    ELEVATED LIVER ENZYMES ELEVATED LIVER ENZYMES   Fluvastatin Other (See Comments)    MYALGIA MYALGIA MYALGIA MYALGIA   Pravachol [Pravastatin Sodium] Other (See Comments)    MYALGIA   Simvastatin Other  (See Comments)    MYALGIA MYALGIA unknown unknown MYALGIA MYALGIA unknown   Other Other (See Comments)    Reductase Inhibitors - unknown   Statins     Review of Systems     Objective:        03/01/2023    9:10 AM 02/10/2023    2:12 PM 02/02/2023    1:48 PM  Vitals with BMI  Height 5\' 6"  5\' 6"  5\' 6"   Weight 157 lbs 157 lbs 6 oz 158 lbs  BMI 25.35 25.42 25.51  Systolic 94 110 104  Diastolic 62 72 68  Pulse 88 86 96    No data found.   Physical Exam Vitals reviewed. Exam conducted with a chaperone present.  Constitutional:      Appearance: Normal appearance.  Genitourinary:    Comments: Dry mucosa of vagina.  Skin:         Comments: Dry scaling rash with possible bites. No rash on hands.  Neurological:     Mental Status: She is alert.     Health Maintenance Due  Topic Date Due   Zoster Vaccines- Shingrix (1 of 2) Never done   COVID-19 Vaccine (6 - 2023-24 season) 07/02/2022    There are no preventive care reminders to display for this patient.   Lab Results  Component Value Date   TSH 3.540 01/07/2023   Lab Results  Component Value Date   WBC 7.2 02/02/2023   HGB 15.1 02/02/2023   HCT 44.1 02/02/2023   MCV 94 02/02/2023   PLT 162 02/02/2023   Lab Results  Component Value Date   NA 133 (L) 02/10/2023   K 4.1 02/10/2023   CO2 27 02/10/2023   GLUCOSE 123 (H) 02/10/2023   BUN 12 02/10/2023   CREATININE 0.83 02/10/2023   BILITOT 0.4 02/10/2023   ALKPHOS 83 02/10/2023   AST 64 (H) 02/10/2023   ALT 78 (H) 02/10/2023   PROT 7.3 02/10/2023   ALBUMIN 4.4 02/10/2023   CALCIUM 9.7 02/10/2023   EGFR 74 02/10/2023   Lab Results  Component Value Date   CHOL 171 01/07/2023   Lab Results  Component Value Date   HDL 55 01/07/2023   Lab Results  Component Value Date   LDLCALC 95 01/07/2023   Lab Results  Component Value Date   TRIG 117 01/07/2023   Lab Results  Component Value Date   CHOLHDL 3.1 01/07/2023   Lab Results  Component  Value Date   HGBA1C 6.0 (H) 01/07/2023       Assessment & Plan:  Atrophic vaginitis Assessment & Plan: Sent estradiol (ESTRACE VAGINAL) 0.1 MG/GM vaginal cream   Orders: -     Estradiol; Apply externally daily x 14 days, then decrease to twice a week.  Dispense: 42.5 g; Refill: 12  Other eczema Assessment & Plan: Triamcinolone cream 0.1% Apply twice daily x 1 week to rash on leg, then twice a day as needed for itchy rash.  Orders: -     Triamcinolone Acetonide; Apply twice daily x 1 week to rash on leg, then twice a day as needed for itchy rash.  Dispense: 45 g; Refill: 0  Chronic idiopathic constipation Assessment & Plan: Decrease linzess 72 mg daily.  Orders: -     linaCLOtide; Take 1 capsule (72 mcg total) by mouth daily before breakfast.  Dispense: 30 capsule; Refill: 5     Meds ordered this encounter  Medications   estradiol (ESTRACE VAGINAL) 0.1 MG/GM vaginal cream    Sig: Apply externally daily x 14 days, then decrease to twice a week.    Dispense:  42.5 g    Refill:  12   triamcinolone cream (KENALOG) 0.1 %    Sig: Apply twice daily x 1 week to rash on leg, then twice a day as needed for itchy rash.    Dispense:  45 g    Refill:  0   linaclotide (LINZESS) 72 MCG capsule  Sig: Take 1 capsule (72 mcg total) by mouth daily before breakfast.    Dispense:  30 capsule    Refill:  5    Follow-up: Return if symptoms worsen or fail to improve.  An After Visit Summary was printed and given to the patient.  Clayborn Bigness I Leal-Borjas,acting as a scribe for Blane Ohara, MD.,have documented all relevant documentation on the behalf of Blane Ohara, MD,as directed by  Blane Ohara, MD while in the presence of Blane Ohara, MD.    Blane Ohara, MD [facility] [phone]

## 2023-03-05 ENCOUNTER — Other Ambulatory Visit: Payer: Self-pay | Admitting: Family Medicine

## 2023-03-05 DIAGNOSIS — L309 Dermatitis, unspecified: Secondary | ICD-10-CM | POA: Insufficient documentation

## 2023-03-05 DIAGNOSIS — R0789 Other chest pain: Secondary | ICD-10-CM

## 2023-03-05 DIAGNOSIS — N760 Acute vaginitis: Secondary | ICD-10-CM | POA: Insufficient documentation

## 2023-03-05 DIAGNOSIS — I483 Typical atrial flutter: Secondary | ICD-10-CM

## 2023-03-05 DIAGNOSIS — E039 Hypothyroidism, unspecified: Secondary | ICD-10-CM

## 2023-03-05 DIAGNOSIS — E038 Other specified hypothyroidism: Secondary | ICD-10-CM

## 2023-03-05 DIAGNOSIS — N952 Postmenopausal atrophic vaginitis: Secondary | ICD-10-CM | POA: Insufficient documentation

## 2023-03-05 NOTE — Assessment & Plan Note (Signed)
Triamcinolone cream 0.1% Apply twice daily x 1 week to rash on leg, then twice a day as needed for itchy rash.

## 2023-03-05 NOTE — Assessment & Plan Note (Signed)
Sent estradiol (ESTRACE VAGINAL) 0.1 MG/GM vaginal cream

## 2023-03-05 NOTE — Assessment & Plan Note (Signed)
Sent estradiol (ESTRACE VAGINAL) 0.1 MG/GM vaginal cream  

## 2023-03-06 NOTE — Assessment & Plan Note (Signed)
Decrease linzess 72 mg daily.

## 2023-03-07 ENCOUNTER — Other Ambulatory Visit: Payer: Self-pay

## 2023-03-16 ENCOUNTER — Telehealth: Payer: Self-pay

## 2023-03-16 DIAGNOSIS — K5904 Chronic idiopathic constipation: Secondary | ICD-10-CM

## 2023-03-16 NOTE — Telephone Encounter (Signed)
Gwen from the group home called and was requesting if we could d/c order for Linzess 72 mcg 1 capsule daily and change order to Linzess 72 mcg 1 capsule PRN constipation. Provider was consulted with and per Dr. Sedalia Muta ok to change and send new order to Group Home. Order was written and hand faxed.

## 2023-03-23 ENCOUNTER — Telehealth: Payer: Self-pay

## 2023-03-23 NOTE — Telephone Encounter (Signed)
Gwen from the group home called stating that Last Monday patient started coming down with a cough/cold, they Gave her a @home  COVID test the following Wednesday which was positive. Patient is now having a continuing cough and she is concerned about the patient she is not running any fever but she complains about the cough during the day and complains about having stomach pain. They are wanting to schedule an appointment to see you, Please advise.

## 2023-03-23 NOTE — Telephone Encounter (Signed)
Gwen from the group home informed.

## 2023-03-30 ENCOUNTER — Telehealth: Payer: Self-pay

## 2023-03-30 NOTE — Telephone Encounter (Signed)
Gwen from the group home is calling asking if the [facility] form for patient has been filled out yet? Her last physical was 01/03/2023. I believe it was placed in your folder last week.

## 2023-03-31 NOTE — Telephone Encounter (Signed)
Completed.

## 2023-04-13 LAB — LAB REPORT - SCANNED: EGFR: 68

## 2023-04-14 ENCOUNTER — Other Ambulatory Visit: Payer: Self-pay | Admitting: Family Medicine

## 2023-04-14 ENCOUNTER — Encounter: Payer: Self-pay | Admitting: Registered Nurse

## 2023-05-09 NOTE — Progress Notes (Unsigned)
Subjective:  Patient ID: Alexandra Barajas, female    DOB: [birth year]  Age: 72 y.o. MRN: [id number]  Chief Complaint  Patient presents with   Medical Management of Chronic Issues    HPI Alexandra Barajas is a 72 year old Caucasian female that presents for fasting appointment. Alexandra Barajas is a resident of [facility] and is accompanied by a staff member  who assists with medical history questions.  Hypertensive heart disease: Patient is currently on Ranexa 500 mg twice daily, losartan 50 mg daily, metoprolol succinate 25 mg 1 daily, HCTZ 12.5 mg daily.   Hyperlipidemia: Currently on Zetia 10 mg daily and Praluent 75 mg every 2 weeks.  GERD: On omeprazole 40 mg daily. reflux is well controlled.   Osteoporosis: Fosamax currently on holiday.  Last bone density was spring 2023.  Depression: Currently on Prozac 20 mg .4 capsules daily  Atrial Fib/flutter: Currently on amiodarone, metoprolol, Eliquis.     05/10/2023    8:24 AM 01/03/2023    2:24 PM 01/03/2023    2:05 PM 08/03/2022    9:46 AM 02/26/2022    9:48 AM  Depression screen PHQ 2/9  Decreased Interest 0 0 0 0 0  Down, Depressed, Hopeless 0 0 0 0 0  PHQ - 2 Score 0 0 0 0 0  Altered sleeping  0     Tired, decreased energy  0     Change in appetite  0     Feeling bad or failure about yourself   0     Trouble concentrating  0     Moving slowly or fidgety/restless  0     Suicidal thoughts  0     PHQ-9 Score  0     Difficult doing work/chores  Not difficult at all           05/10/2023    8:22 AM  Fall Risk   Falls in the past year? 1  Number falls in past yr: 1  Injury with Fall? 1    Patient Care Team: Blane Ohara, MD as PCP - General (Family Medicine) Diamond Nickel., MD as Referring Physician (Cardiology)   Review of Systems  Constitutional:  Positive for fatigue. Negative for chills and fever.  HENT:  Negative for congestion, rhinorrhea and sore throat.   Respiratory:  Negative for cough and shortness of breath.   Cardiovascular:   Negative for chest pain.  Gastrointestinal:  Positive for abdominal pain, constipation, nausea and vomiting. Negative for diarrhea.       Hemorrhoids   Genitourinary:  Positive for vaginal bleeding (despite treatment for atrophic vaginitis). Negative for dysuria and urgency.  Musculoskeletal:  Negative for back pain and myalgias.  Neurological:  Positive for headaches. Negative for dizziness, weakness and light-headedness.       Balance issues.   Psychiatric/Behavioral:  Negative for dysphoric mood. The patient is not nervous/anxious.     Current Outpatient Medications on File Prior to Visit  Medication Sig Dispense Refill   alendronate (FOSAMAX) 70 MG tablet Take 1 tab by mouth on same day each week w/8oz water, remain upright & no food/drink for *Med will not cycle, facility to reorder* 4 tablet 11   amiodarone (PACERONE) 200 MG tablet Take 1 tablet by mouth daily.     apixaban (ELIQUIS) 5 MG TABS tablet Take by mouth.     chlorhexidine (PERIDEX) 0.12 % solution RINSE WITH 1 TEASPOONFUL (5 ML) EVERY MORNING 473 mL 11   Docusate Sodium (DSS) 100 MG CAPS  Take 1 tablet by mouth daily. 30 capsule 11   estradiol (ESTRACE VAGINAL) 0.1 MG/GM vaginal cream Apply externally daily x 14 days, then decrease to twice a week. 42.5 g 12   ezetimibe (ZETIA) 10 MG tablet TAKE 1 TABLET BY MOUTH ONCE DAILY 30 tablet 11   FLUoxetine (PROZAC) 20 MG capsule TAKE 4 CAPSULES (80MG ) BY MOUTH EVERY MORNING 120 capsule 11   hydrochlorothiazide (HYDRODIURIL) 12.5 MG tablet TAKE 1 TABLET BY MOUTH ONCE DAILY 30 tablet 11   lactulose (CHRONULAC) 10 GM/15ML solution TAKE 15 ML BY MOUTH ONCE DAILY 473 mL 11   levothyroxine (SYNTHROID) 50 MCG tablet TAKE 1 TABLET BY MOUTH ONCE DAILY 30 tablet 11   linaclotide (LINZESS) 72 MCG capsule Take 72 mcg by mouth as needed (Constipation).     loratadine (CLARITIN) 10 MG tablet Take by mouth.     losartan (COZAAR) 50 MG tablet TAKE 1 TABLET BY MOUTH ONCE DAILY 30 tablet 11    metoprolol succinate (TOPROL-XL) 25 MG 24 hr tablet TAKE 1 TABLET BY MOUTH ONCE DAILY 30 tablet 11   omeprazole (PRILOSEC) 40 MG capsule TAKE 1 CAPSULE BY MOUTH ONCE DAILY 30 capsule 11   ondansetron (ZOFRAN) 4 MG tablet Take 4 mg by mouth every 8 (eight) hours as needed.     PRALUENT 75 MG/ML SOAJ Inject 1 Syringe into the skin every 14 (fourteen) days.     ranolazine (RANEXA) 500 MG 12 hr tablet TAKE 1 TABLET BY MOUTH TWICE DAILY * DO NOT CRUSH * 20 tablet 11   triamcinolone cream (KENALOG) 0.1 % Apply twice daily x 1 week to rash on leg, then twice a day as needed for itchy rash. 45 g 0   Wheat Dextrin (BENEFIBER) POWD Stir 2 teaspoons of Benefiber into 4-8 oz of your favorite non-carbonated beverage or soft food (hot or cold). Stir well until dissolved. 2 teaspoons three times daily. Do not exceed 6 teaspoons per day. 730 g 11   No current facility-administered medications on file prior to visit.   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    Atrophy of thyroid    Coronary artery disease 2003   cabg   Depression    Hyperlipidemia    Hypertension    Hypertensive heart disease without heart failure    Moderate intellectual disabilities    Osteoporosis    Other nonrheumatic aortic valve disorders    Other specified disorders of bone density and structure, multiple sites    Presence of aortocoronary bypass graft    Presence of prosthetic heart valve    Past Surgical History:  Procedure Laterality Date   ABDOMINAL HYSTERECTOMY     BL oophorectomy   ANKLE SURGERY Left    CARDIAC VALVE REPLACEMENT     2003 (aortic valve and mitral valve replacements.)   CORONARY ARTERY BYPASS GRAFT      Family History  Problem Relation Age of Onset   Breast cancer Other    Social History   Socioeconomic History   Marital status: Single    Spouse name: Not  on file   Number of children: Not on file   Years of education: Not on file   Highest education level: Not on file  Occupational History   Not on file  Tobacco Use   Smoking status: Never   Smokeless tobacco: Never  Substance  and Sexual Activity   Alcohol use: Never   Drug use: Never   Sexual activity: Not Currently  Other Topics Concern   Not on file  Social History Narrative   Lives at [facility]   Social Determinants of Health   Financial Resource Strain: Low Risk  (01/03/2023)   Overall Financial Resource Strain (CARDIA)    Difficulty of Paying Living Expenses: Not hard at all  Food Insecurity: No Food Insecurity (01/03/2023)   Hunger Vital Sign    Worried About Running Out of Food in the Last Year: Never true    Ran Out of Food in the Last Year: Never true  Transportation Needs: No Transportation Needs (01/03/2023)   PRAPARE - Administrator, Civil Service (Medical): No    Lack of Transportation (Non-Medical): No  Physical Activity: Inactive (01/03/2023)   Exercise Vital Sign    Days of Exercise per Week: 0 days    Minutes of Exercise per Session: 0 min  Stress: No Stress Concern Present (01/03/2023)   [employer] of Occupational Health - Occupational Stress Questionnaire    Feeling of Stress : Not at all  Social Connections: Socially Isolated (01/03/2023)   Social Connection and Isolation Panel [NHANES]    Frequency of Communication with Friends and Family: Three times a week    Frequency of Social Gatherings with Friends and Family: Three times a week    Attends Religious Services: Never    Active Member of Clubs or Organizations: No    Attends Banker Meetings: Never    Marital Status: Never married    Objective:  BP 110/60   Pulse 68   Temp (!) 96.9 F (36.1 C)   Resp 18   Ht 5\' 6"  (1.676 m)   Wt 157 lb 3.2 oz (71.3 kg)   BMI 25.37 kg/m      05/10/2023    8:14 AM 03/01/2023    9:10 AM 02/10/2023    2:12 PM  BP/Weight  Systolic BP  110 94 110  Diastolic BP 60 62 72  Wt. (Lbs) 157.2 157 157.4  BMI 25.37 kg/m2 25.34 kg/m2 25.41 kg/m2    Physical Exam Vitals reviewed.  Constitutional:      General: Alexandra Barajas is not in acute distress.    Appearance: Normal appearance. Alexandra Barajas is normal weight.  HENT:     Right Ear: Tympanic membrane, ear canal and external ear normal.     Left Ear: Tympanic membrane, ear canal and external ear normal.     Nose: Nose normal. No congestion or rhinorrhea.     Mouth/Throat:     Dentition: Dental caries present.  Eyes:     Conjunctiva/sclera: Conjunctivae normal.  Neck:     Thyroid: No thyroid mass.     Vascular: No carotid bruit.  Cardiovascular:     Rate and Rhythm: Normal rate and regular rhythm.     Heart sounds: Normal heart sounds. No murmur heard. Pulmonary:     Effort: Pulmonary effort is normal.     Breath sounds: Normal breath sounds.  Abdominal:     General: Bowel sounds are normal.     Palpations: Abdomen is soft. There is no mass.     Tenderness: There is no abdominal tenderness.  Lymphadenopathy:     Cervical: No cervical adenopathy.  Neurological:     Mental Status: Alexandra Barajas is alert and oriented to person, place, and time.     Cranial Nerves: No cranial nerve deficit.  Psychiatric:  Mood and Affect: Mood normal.        Behavior: Behavior normal.     Diabetic Foot Exam - Simple   No data filed      Lab Results  Component Value Date   WBC 7.0 05/10/2023   HGB 12.5 05/10/2023   HCT 37.9 05/10/2023   PLT 121 (L) 05/10/2023   GLUCOSE 111 (H) 05/10/2023   CHOL 175 05/10/2023   TRIG 125 05/10/2023   HDL 59 05/10/2023   LDLCALC 94 05/10/2023   ALT 55 (H) 05/10/2023   AST 73 (H) 05/10/2023   NA 132 (L) 05/10/2023   K 4.9 05/10/2023   CL 92 (L) 05/10/2023   CREATININE 0.90 05/10/2023   BUN 10 05/10/2023   CO2 27 05/10/2023   TSH 3.540 01/07/2023   HGBA1C 6.0 (H) 05/10/2023      Assessment & Plan:    Acquired hypothyroidism Assessment &  Plan: Previously well controlled Continue Synthroid at current dose  Recheck TSH and adjust Synthroid as indicated     Hypertension with heart disease Assessment & Plan: Patient is currently on Ranexa 500 mg twice daily, losartan 50 mg daily, metoprolol succinate 25 mg 1 daily, HCTZ 12.5 mg daily.   Orders: -     CBC with Differential/Platelet -     Comprehensive metabolic panel  Mixed hyperlipidemia Assessment & Plan: Well controlled.  No changes to medicines.  Continue Zetia 10 mg daily and praluent. Continue to work on eating a healthy diet and exercise. Labs drawn today.    Orders: -     Lipid panel  Prediabetes Assessment & Plan: Recommend continue to work on eating healthy diet and exercise.   Orders: -     Hemoglobin A1c  Mental disability Assessment & Plan: Continued care at group home.   Typical atrial flutter (HCC) Assessment & Plan: Continue Toprol-XL, amiodarone, and eliquis.    Acute cystitis with hematuria Assessment & Plan: Prescription for Macrobid 100 twice daily x 1 week given.  Orders: -     POCT URINALYSIS DIP (CLINITEK) -     Urine Culture  Postmenopausal bleeding Assessment & Plan: Refer to gynecology.  Orders: -     Ambulatory referral to Gynecology  Major depressive disorder, recurrent episode, mild (HCC) Assessment & Plan: Management per specialist.  Continue Prozac 20 mg 4 daily.   Myalgia due to statin Assessment & Plan: Intolerant to statins.   Atherosclerosis of native coronary artery of native heart with stable angina pectoris [facility]) Assessment & Plan: The current medical regimen is effective;  continue present plan and medications. Management per specialist.  Patient is currently on Ranexa 500 mg twice daily, losartan 50 mg daily, metoprolol succinate 25 mg 1 daily, and praluent.   Other orders -     Nitrofurantoin Monohyd Macro; Take 1 capsule (100 mg total) by mouth 2 (two) times daily.  Dispense: 14 capsule;  Refill: 0     Meds ordered this encounter  Medications   nitrofurantoin, macrocrystal-monohydrate, (MACROBID) 100 MG capsule    Sig: Take 1 capsule (100 mg total) by mouth 2 (two) times daily.    Dispense:  14 capsule    Refill:  0    Orders Placed This Encounter  Procedures   Urine Culture   CBC with Differential/Platelet   Comprehensive metabolic panel   Hemoglobin A1c   Lipid panel   Ambulatory referral to Gynecology   POCT URINALYSIS DIP (CLINITEK)     Follow-up: Return in about 6 weeks (  around 06/21/2023) for chronic follow up.  Also recommend 75-month follow-up.  Neither fasting.Clayborn Bigness I Leal-Borjas,acting as a scribe for Blane Ohara, MD.,have documented all relevant documentation on the behalf of Blane Ohara, MD,as directed by  Blane Ohara, MD while in the presence of Blane Ohara, MD.   An After Visit Summary was printed and given to the patient.  Blane Ohara, MD [facility] [phone]

## 2023-05-10 ENCOUNTER — Ambulatory Visit (INDEPENDENT_AMBULATORY_CARE_PROVIDER_SITE_OTHER): Payer: Medicare Other | Admitting: Family Medicine

## 2023-05-10 ENCOUNTER — Encounter: Payer: Self-pay | Admitting: Family Medicine

## 2023-05-10 VITALS — BP 110/60 | HR 68 | Temp 96.9°F | Resp 18 | Ht 66.0 in | Wt 157.2 lb

## 2023-05-10 DIAGNOSIS — E782 Mixed hyperlipidemia: Secondary | ICD-10-CM | POA: Diagnosis not present

## 2023-05-10 DIAGNOSIS — N3001 Acute cystitis with hematuria: Secondary | ICD-10-CM | POA: Diagnosis not present

## 2023-05-10 DIAGNOSIS — T466X5A Adverse effect of antihyperlipidemic and antiarteriosclerotic drugs, initial encounter: Secondary | ICD-10-CM

## 2023-05-10 DIAGNOSIS — I119 Hypertensive heart disease without heart failure: Secondary | ICD-10-CM | POA: Diagnosis not present

## 2023-05-10 DIAGNOSIS — E039 Hypothyroidism, unspecified: Secondary | ICD-10-CM | POA: Diagnosis not present

## 2023-05-10 DIAGNOSIS — I483 Typical atrial flutter: Secondary | ICD-10-CM

## 2023-05-10 DIAGNOSIS — N950 Postmenopausal bleeding: Secondary | ICD-10-CM

## 2023-05-10 DIAGNOSIS — F79 Unspecified intellectual disabilities: Secondary | ICD-10-CM

## 2023-05-10 DIAGNOSIS — F330 Major depressive disorder, recurrent, mild: Secondary | ICD-10-CM

## 2023-05-10 DIAGNOSIS — R7303 Prediabetes: Secondary | ICD-10-CM | POA: Diagnosis not present

## 2023-05-10 DIAGNOSIS — I25118 Atherosclerotic heart disease of native coronary artery with other forms of angina pectoris: Secondary | ICD-10-CM

## 2023-05-10 DIAGNOSIS — M791 Myalgia, unspecified site: Secondary | ICD-10-CM

## 2023-05-10 LAB — CBC WITH DIFFERENTIAL/PLATELET
Basophils Absolute: 0 10*3/uL (ref 0.0–0.2)
Basos: 1 %
EOS (ABSOLUTE): 0.1 10*3/uL (ref 0.0–0.4)
Eos: 2 %
Hematocrit: 37.9 % (ref 34.0–46.6)
Hemoglobin: 12.5 g/dL (ref 11.1–15.9)
Immature Grans (Abs): 0 10*3/uL (ref 0.0–0.1)
Immature Granulocytes: 1 %
Lymphocytes Absolute: 1.7 10*3/uL (ref 0.7–3.1)
Lymphs: 24 %
MCH: 31.8 pg (ref 26.6–33.0)
MCHC: 33 g/dL (ref 31.5–35.7)
MCV: 96 fL (ref 79–97)
Monocytes Absolute: 1 10*3/uL — ABNORMAL HIGH (ref 0.1–0.9)
Monocytes: 14 %
Neutrophils Absolute: 4.1 10*3/uL (ref 1.4–7.0)
Neutrophils: 58 %
Platelets: 121 10*3/uL — ABNORMAL LOW (ref 150–450)
RBC: 3.93 x10E6/uL (ref 3.77–5.28)
RDW: 13.5 % (ref 11.7–15.4)
WBC: 7 10*3/uL (ref 3.4–10.8)

## 2023-05-10 LAB — COMPREHENSIVE METABOLIC PANEL
ALT: 55 IU/L — ABNORMAL HIGH (ref 0–32)
AST: 73 IU/L — ABNORMAL HIGH (ref 0–40)
Albumin: 4.2 g/dL (ref 3.8–4.8)
Alkaline Phosphatase: 82 IU/L (ref 44–121)
BUN/Creatinine Ratio: 11 — ABNORMAL LOW (ref 12–28)
BUN: 10 mg/dL (ref 8–27)
Bilirubin Total: 0.6 mg/dL (ref 0.0–1.2)
CO2: 27 mmol/L (ref 20–29)
Calcium: 9.8 mg/dL (ref 8.7–10.3)
Chloride: 92 mmol/L — ABNORMAL LOW (ref 96–106)
Creatinine, Ser: 0.9 mg/dL (ref 0.57–1.00)
Globulin, Total: 2.9 g/dL (ref 1.5–4.5)
Glucose: 111 mg/dL — ABNORMAL HIGH (ref 70–99)
Potassium: 4.9 mmol/L (ref 3.5–5.2)
Sodium: 132 mmol/L — ABNORMAL LOW (ref 134–144)
Total Protein: 7.1 g/dL (ref 6.0–8.5)
eGFR: 68 mL/min/{1.73_m2} (ref >59)

## 2023-05-10 LAB — LIPID PANEL
Chol/HDL Ratio: 3 ratio (ref 0.0–4.4)
Cholesterol, Total: 175 mg/dL (ref 100–199)
HDL: 59 mg/dL (ref >39)
LDL Chol Calc (NIH): 94 mg/dL (ref 0–99)
Triglycerides: 125 mg/dL (ref 0–149)
VLDL Cholesterol Cal: 22 mg/dL (ref 5–40)

## 2023-05-10 LAB — POCT URINALYSIS DIP (CLINITEK)
Bilirubin, UA: NEGATIVE
Glucose, UA: NEGATIVE mg/dL
Ketones, POC UA: NEGATIVE mg/dL
Nitrite, UA: POSITIVE — AB
POC PROTEIN,UA: NEGATIVE
Spec Grav, UA: 1.01 (ref 1.010–1.025)
Urobilinogen, UA: 0.2 E.U./dL
pH, UA: 7.5 (ref 5.0–8.0)

## 2023-05-10 LAB — HEMOGLOBIN A1C
Est. average glucose Bld gHb Est-mCnc: 126 mg/dL
Hgb A1c MFr Bld: 6 % — ABNORMAL HIGH (ref 4.8–5.6)

## 2023-05-10 MED ORDER — NITROFURANTOIN MONOHYD MACRO 100 MG PO CAPS: 100.0000 mg | ORAL_CAPSULE | Freq: Two times a day (BID) | ORAL | 0 refills | Status: DC

## 2023-05-10 NOTE — Patient Instructions (Addendum)
Stop lactulose. Change Linzess to 72 mg once daily (not just as needed) Patient has urinary tract infection: Given Macrobid 100 mg twice daily x 7 days. If persistent lower abdominal pain I would recommend proceeding with CT scan. Recommend get shingles vaccine series at a local pharmacy. Referral to gynecologist for vaginal bleeding.

## 2023-05-12 LAB — URINE CULTURE

## 2023-05-13 ENCOUNTER — Other Ambulatory Visit: Payer: Self-pay

## 2023-05-15 DIAGNOSIS — N950 Postmenopausal bleeding: Secondary | ICD-10-CM | POA: Insufficient documentation

## 2023-05-15 HISTORY — DX: Postmenopausal bleeding: N95.0

## 2023-05-15 NOTE — Assessment & Plan Note (Signed)
Patient is currently on Ranexa 500 mg twice daily, losartan 50 mg daily, metoprolol succinate 25 mg 1 daily, HCTZ 12.5 mg daily.

## 2023-05-15 NOTE — Assessment & Plan Note (Signed)
Refer to gynecology. 

## 2023-05-15 NOTE — Assessment & Plan Note (Signed)
Management per specialist.  Continue Prozac 20 mg 4 daily.

## 2023-05-15 NOTE — Assessment & Plan Note (Signed)
Recommend continue to work on eating healthy diet and exercise.  

## 2023-05-15 NOTE — Assessment & Plan Note (Signed)
The current medical regimen is effective;  continue present plan and medications. Management per specialist.  Patient is currently on Ranexa 500 mg twice daily, losartan 50 mg daily, metoprolol succinate 25 mg 1 daily, and praluent.

## 2023-05-15 NOTE — Assessment & Plan Note (Signed)
Previously well controlled Continue Synthroid at current dose  Recheck TSH and adjust Synthroid as indicated   

## 2023-05-15 NOTE — Assessment & Plan Note (Signed)
Intolerant to statins. 

## 2023-05-15 NOTE — Assessment & Plan Note (Signed)
Continue Toprol-XL, amiodarone, and eliquis.

## 2023-05-15 NOTE — Assessment & Plan Note (Signed)
Continued care at group home.

## 2023-05-15 NOTE — Assessment & Plan Note (Signed)
Prescription for Macrobid 100 twice daily x 1 week given.

## 2023-05-15 NOTE — Assessment & Plan Note (Signed)
Well controlled.  No changes to medicines.  Continue Zetia 10 mg daily and praluent. Continue to work on eating a healthy diet and exercise. Labs drawn today.

## 2023-05-16 DIAGNOSIS — Z0279 Encounter for issue of other medical certificate: Secondary | ICD-10-CM

## 2023-05-16 LAB — HM MAMMOGRAPHY

## 2023-05-17 ENCOUNTER — Encounter: Payer: Self-pay | Admitting: Family Medicine

## 2023-05-23 ENCOUNTER — Other Ambulatory Visit: Payer: Self-pay | Admitting: Family Medicine

## 2023-05-23 ENCOUNTER — Telehealth: Payer: Self-pay | Admitting: Family Medicine

## 2023-05-23 MED ORDER — POLYETHYLENE GLYCOL 3350 17 GM/SCOOP PO POWD: 17.0000 g | Freq: Two times a day (BID) | ORAL | 11 refills | Status: DC

## 2023-05-23 MED ORDER — DSS 100 MG PO CAPS: 100.0000 mg | ORAL_CAPSULE | Freq: Every day | ORAL | 11 refills | Status: DC

## 2023-05-23 NOTE — Telephone Encounter (Signed)
Patient's caretaker, Fenton Foy, report of the patient has been vomiting yesterday as well as having constipation.  She spoke with the caretaker over the weekend he reported the symptoms.  She is requesting to restart MiraLAX and a stool softener.  Sent MiraLAX 17 g twice daily and Colace 100 mg daily.  Call back if continues to vomit.  May need appointment.

## 2023-06-21 ENCOUNTER — Ambulatory Visit: Payer: Medicare Other | Admitting: Family Medicine

## 2023-06-27 ENCOUNTER — Ambulatory Visit: Payer: Medicare Other | Admitting: Family Medicine

## 2023-06-27 VITALS — BP 110/70 | HR 60 | Temp 95.6°F | Resp 16 | Ht 66.0 in | Wt 157.6 lb

## 2023-06-27 DIAGNOSIS — R630 Anorexia: Secondary | ICD-10-CM | POA: Diagnosis not present

## 2023-06-27 DIAGNOSIS — R1030 Lower abdominal pain, unspecified: Secondary | ICD-10-CM

## 2023-06-27 DIAGNOSIS — R197 Diarrhea, unspecified: Secondary | ICD-10-CM | POA: Diagnosis not present

## 2023-06-27 DIAGNOSIS — R1084 Generalized abdominal pain: Secondary | ICD-10-CM

## 2023-06-27 DIAGNOSIS — R7989 Other specified abnormal findings of blood chemistry: Secondary | ICD-10-CM

## 2023-06-27 DIAGNOSIS — E860 Dehydration: Secondary | ICD-10-CM | POA: Diagnosis not present

## 2023-06-27 LAB — CMP14+EGFR
ALT: 111 IU/L — ABNORMAL HIGH (ref 0–32)
AST: 192 IU/L — ABNORMAL HIGH (ref 0–40)
Albumin: 4.2 g/dL (ref 3.8–4.8)
Alkaline Phosphatase: 109 IU/L (ref 44–121)
BUN/Creatinine Ratio: 11 — ABNORMAL LOW (ref 12–28)
BUN: 8 mg/dL (ref 8–27)
Bilirubin Total: 0.7 mg/dL (ref 0.0–1.2)
CO2: 21 mmol/L (ref 20–29)
Calcium: 9.6 mg/dL (ref 8.7–10.3)
Chloride: 90 mmol/L — ABNORMAL LOW (ref 96–106)
Creatinine, Ser: 0.76 mg/dL (ref 0.57–1.00)
Globulin, Total: 3.2 g/dL (ref 1.5–4.5)
Glucose: 99 mg/dL (ref 70–99)
Potassium: 5.4 mmol/L — ABNORMAL HIGH (ref 3.5–5.2)
Sodium: 128 mmol/L — ABNORMAL LOW (ref 134–144)
Total Protein: 7.4 g/dL (ref 6.0–8.5)
eGFR: 83 mL/min/{1.73_m2} (ref >59)

## 2023-06-27 LAB — CBC WITH DIFFERENTIAL/PLATELET
Basophils Absolute: 0 10*3/uL (ref 0.0–0.2)
Basos: 1 %
EOS (ABSOLUTE): 0.2 10*3/uL (ref 0.0–0.4)
Eos: 2 %
Hematocrit: 41.7 % (ref 34.0–46.6)
Hemoglobin: 13.9 g/dL (ref 11.1–15.9)
Immature Grans (Abs): 0 10*3/uL (ref 0.0–0.1)
Immature Granulocytes: 1 %
Lymphocytes Absolute: 1.9 10*3/uL (ref 0.7–3.1)
Lymphs: 22 %
MCH: 32.3 pg (ref 26.6–33.0)
MCHC: 33.3 g/dL (ref 31.5–35.7)
MCV: 97 fL (ref 79–97)
Monocytes Absolute: 1 10*3/uL — ABNORMAL HIGH (ref 0.1–0.9)
Monocytes: 12 %
Neutrophils Absolute: 5.3 10*3/uL (ref 1.4–7.0)
Neutrophils: 62 %
Platelets: 125 10*3/uL — ABNORMAL LOW (ref 150–450)
RBC: 4.3 x10E6/uL (ref 3.77–5.28)
RDW: 12.6 % (ref 11.7–15.4)
WBC: 8.5 10*3/uL (ref 3.4–10.8)

## 2023-06-27 NOTE — Patient Instructions (Signed)
Discontinue lactulose. Discontinue dulcolax. Discontinue miralax. CT scan abdomen/pelvis w/contrast to be scheduled.

## 2023-06-27 NOTE — Progress Notes (Signed)
Subjective:  Patient ID: Alexandra Barajas, female    DOB: 1951-07-05  Age: 72 y.o. MRN: [id number]  Chief Complaint  Patient presents with   Lack of appetite   Diarrhea    HPI Alexandra Barajas is a mentally disabled WF with pmhx of cad, hypothyroidism, hyperlipidemia, hypertension, and osteoporosis comes in for her 6 week follow-up.  According to [facility] from the Group Home she is not eating or drinking enough and is sleeping all the time.  She currently is having diarrhea and abdominal pain.  She has had not fever or chills.  No cough reported.  She is having "blow out bowel movements twice daily."      05/10/2023    8:24 AM 01/03/2023    2:24 PM 01/03/2023    2:05 PM 08/03/2022    9:46 AM 02/26/2022    9:48 AM  Depression screen PHQ 2/9  Decreased Interest 0 0 0 0 0  Down, Depressed, Hopeless 0 0 0 0 0  PHQ - 2 Score 0 0 0 0 0  Altered sleeping  0     Tired, decreased energy  0     Change in appetite  0     Feeling bad or failure about yourself   0     Trouble concentrating  0     Moving slowly or fidgety/restless  0     Suicidal thoughts  0     PHQ-9 Score  0     Difficult doing work/chores  Not difficult at all           05/10/2023    8:22 AM  Fall Risk   Falls in the past year? 1  Number falls in past yr: 1  Injury with Fall? 1    Patient Care Team: Blane Ohara, MD as PCP - General (Family Medicine) Diamond Nickel., MD as Referring Physician (Cardiology)   Review of Systems  Constitutional:  Positive for fatigue. Negative for chills and fever.  HENT:  Negative for congestion, ear pain, rhinorrhea and sore throat.   Respiratory:  Negative for cough and shortness of breath.   Cardiovascular:  Negative for chest pain.  Gastrointestinal:  Positive for diarrhea. Negative for abdominal pain, constipation, nausea and vomiting.  Genitourinary:  Negative for dysuria and urgency.  Musculoskeletal:  Positive for back pain. Negative for myalgias.  Neurological:  Positive for weakness.  Negative for dizziness, light-headedness and headaches.  Psychiatric/Behavioral:  Negative for dysphoric mood. The patient is not nervous/anxious.     Current Outpatient Medications on File Prior to Visit  Medication Sig Dispense Refill   alendronate (FOSAMAX) 70 MG tablet Take 1 tab by mouth on same day each week w/8oz water, remain upright & no food/drink for *Med will not cycle, facility to reorder* 4 tablet 11   amiodarone (PACERONE) 200 MG tablet Take 1 tablet by mouth daily.     apixaban (ELIQUIS) 5 MG TABS tablet Take by mouth.     chlorhexidine (PERIDEX) 0.12 % solution RINSE WITH 1 TEASPOONFUL (5 ML) EVERY MORNING 473 mL 11   Docusate Sodium (DSS) 100 MG CAPS Take 1 capsule (100 mg total) by mouth daily. 30 capsule 11   estradiol (ESTRACE VAGINAL) 0.1 MG/GM vaginal cream Apply externally daily x 14 days, then decrease to twice a week. 42.5 g 12   ezetimibe (ZETIA) 10 MG tablet TAKE 1 TABLET BY MOUTH ONCE DAILY 30 tablet 11   FLUoxetine (PROZAC) 20 MG capsule TAKE 4 CAPSULES (80MG ) BY MOUTH  EVERY MORNING 120 capsule 11   furosemide (LASIX) 20 MG tablet      hydrochlorothiazide (HYDRODIURIL) 12.5 MG tablet TAKE 1 TABLET BY MOUTH ONCE DAILY 30 tablet 11   lactulose (CHRONULAC) 10 GM/15ML solution TAKE 15 ML BY MOUTH ONCE DAILY 473 mL 11   levothyroxine (SYNTHROID) 50 MCG tablet TAKE 1 TABLET BY MOUTH ONCE DAILY 30 tablet 11   linaclotide (LINZESS) 72 MCG capsule Take 72 mcg by mouth as needed (Constipation).     loratadine (CLARITIN) 10 MG tablet Take by mouth.     losartan (COZAAR) 50 MG tablet TAKE 1 TABLET BY MOUTH ONCE DAILY 30 tablet 11   magnesium oxide (MAG-OX) 400 MG tablet Take by mouth.     metoprolol succinate (TOPROL-XL) 25 MG 24 hr tablet TAKE 1 TABLET BY MOUTH ONCE DAILY 30 tablet 11   nitrofurantoin, macrocrystal-monohydrate, (MACROBID) 100 MG capsule Take 1 capsule (100 mg total) by mouth 2 (two) times daily. 14 capsule 0   omeprazole (PRILOSEC) 40 MG capsule TAKE  1 CAPSULE BY MOUTH ONCE DAILY 30 capsule 11   ondansetron (ZOFRAN) 4 MG tablet Take 4 mg by mouth every 8 (eight) hours as needed.     polyethylene glycol powder (GLYCOLAX/MIRALAX) 17 GM/SCOOP powder Take 17 g by mouth 2 (two) times daily. 510 g 11   PRALUENT 75 MG/ML SOAJ Inject 1 Syringe into the skin every 14 (fourteen) days.     Prenatal Vit-Fe Fumarate-FA (MULTIVITAMIN-PRENATAL) 27-0.8 MG TABS tablet Take 1 tablet by mouth daily.     ranolazine (RANEXA) 500 MG 12 hr tablet TAKE 1 TABLET BY MOUTH TWICE DAILY * DO NOT CRUSH * 20 tablet 11   triamcinolone cream (KENALOG) 0.1 % Apply twice daily x 1 week to rash on leg, then twice a day as needed for itchy rash. 45 g 0   Wheat Dextrin (BENEFIBER) POWD Stir 2 teaspoons of Benefiber into 4-8 oz of your favorite non-carbonated beverage or soft food (hot or cold). Stir well until dissolved. 2 teaspoons three times daily. Do not exceed 6 teaspoons per day. 730 g 11   No current facility-administered medications on file prior to visit.   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    Atrophy of thyroid    Coronary artery disease 2003   cabg   Depression    Hyperlipidemia    Hypertension    Hypertensive heart disease without heart failure    Moderate intellectual disabilities    Osteoporosis    Other nonrheumatic aortic valve disorders    Other specified disorders of bone density and structure, multiple sites    Presence of aortocoronary bypass graft    Presence of prosthetic heart valve    Past Surgical History:  Procedure Laterality Date   ABDOMINAL HYSTERECTOMY     BL oophorectomy   ANKLE SURGERY Left    CARDIAC VALVE REPLACEMENT     2003 (aortic valve and mitral valve replacements.)   CORONARY ARTERY BYPASS GRAFT      Family History  Problem Relation Age of Onset   Breast cancer Other    Social  History   Socioeconomic History   Marital status: Single    Spouse name: Not on file   Number of children: Not on file   Years of education: Not on file   Highest education level: Not on file  Occupational History   Not on file  Tobacco Use   Smoking status: Never   Smokeless tobacco: Never  Substance and Sexual Activity   Alcohol use: Never   Drug use: Never   Sexual activity: Not Currently  Other Topics Concern   Not on file  Social History Narrative   Lives at [facility]   Social Determinants of Health   Financial Resource Strain: Low Risk  (01/03/2023)   Overall Financial Resource Strain (CARDIA)    Difficulty of Paying Living Expenses: Not hard at all  Food Insecurity: No Food Insecurity (01/03/2023)   Hunger Vital Sign    Worried About Running Out of Food in the Last Year: Never true    Ran Out of Food in the Last Year: Never true  Transportation Needs: No Transportation Needs (01/03/2023)   PRAPARE - Administrator, Civil Service (Medical): No    Lack of Transportation (Non-Medical): No  Physical Activity: Inactive (01/03/2023)   Exercise Vital Sign    Days of Exercise per Week: 0 days    Minutes of Exercise per Session: 0 min  Stress: No Stress Concern Present (01/03/2023)   [employer] of Occupational Health - Occupational Stress Questionnaire    Feeling of Stress : Not at all  Social Connections: Socially Isolated (01/03/2023)   Social Connection and Isolation Panel [NHANES]    Frequency of Communication with Friends and Family: Three times a week    Frequency of Social Gatherings with Friends and Family: Three times a week    Attends Religious Services: Never    Active Member of Clubs or Organizations: No    Attends Engineer, structural: Never    Marital Status: Never married    Objective:  BP 110/70   Pulse 60   Temp (!) 95.6 F (35.3 C)   Resp 16   Ht 5\' 6"  (1.676 m)   Wt 157 lb 9.6 oz (71.5 kg)   BMI 25.44 kg/m      06/27/2023     3:24 PM 05/10/2023    8:14 AM 03/01/2023    9:10 AM  BP/Weight  Systolic BP 110 110 94  Diastolic BP 70 60 62  Wt. (Lbs) 157.6 157.2 157  BMI 25.44 kg/m2 25.37 kg/m2 25.34 kg/m2    Physical Exam Vitals reviewed.  Constitutional:      Appearance: She is normal weight.  Cardiovascular:     Rate and Rhythm: Normal rate and regular rhythm.     Heart sounds: Normal heart sounds.  Pulmonary:     Effort: Pulmonary effort is normal. No respiratory distress.     Breath sounds: Normal breath sounds.  Abdominal:     General: Abdomen is flat. Bowel sounds are normal.     Palpations: Abdomen is soft.     Tenderness: There is abdominal tenderness (lower abdomen).  Neurological:     Mental Status: She is alert and oriented to person, place, and time.  Psychiatric:        Mood and Affect: Mood normal.        Behavior: Behavior normal.     Diabetic Foot Exam - Simple   No data filed      Lab Results  Component Value Date   WBC 8.5 06/27/2023   HGB 13.9 06/27/2023   HCT 41.7 06/27/2023   PLT 125 (L) 06/27/2023   GLUCOSE 99 06/27/2023   CHOL 175 05/10/2023   TRIG 125 05/10/2023   HDL 59 05/10/2023  LDLCALC 94 05/10/2023   ALT 111 (H) 06/27/2023   AST 192 (H) 06/27/2023   NA 128 (L) 06/27/2023   K 5.4 (H) 06/27/2023   CL 90 (L) 06/27/2023   CREATININE 0.76 06/27/2023   BUN 8 06/27/2023   CO2 21 06/27/2023   TSH 3.540 01/07/2023   HGBA1C 6.0 (H) 05/10/2023      Assessment & Plan:    Lower abdominal pain Assessment & Plan: CT scan abdomen/pelvis w/contrast to be scheduled stat. Unable to get the same day, so called to get stat in am.  Unfortunately, I could not get it on Wednesday and I sent to ED for evaluation.   Check labs  Orders: -     CMP14+EGFR -     CBC with Differential/Platelet  Diarrhea, unspecified type Assessment & Plan: Discontinue lactulose. Discontinue dulcolax. Discontinue miralax.   Anorexia Assessment & Plan: Check labs.  Check ct  scan abd/pelvis with contrast.   Dehydration Assessment & Plan: Recommend hydration.   Elevated liver function tests Assessment & Plan: Check labs. Check ct of abd/pelvis with contrast.      Orders Placed This Encounter  Procedures   CMP14+EGFR   CBC with Differential/Platelet     Follow-up: Return if symptoms worsen or fail to improve.   I,Carolyn M Morrison,acting as a Neurosurgeon for Blane Ohara, MD.,have documented all relevant documentation on the behalf of Blane Ohara, MD,as directed by  Blane Ohara, MD while in the presence of Blane Ohara, MD.   Clayborn Bigness I Leal-Borjas,acting as a scribe for Blane Ohara, MD.,have documented all relevant documentation on the behalf of Blane Ohara, MD,as directed by  Blane Ohara, MD while in the presence of Blane Ohara, MD.    An After Visit Summary was printed and given to the patient.  I attest that I have reviewed this visit and agree with the plan scribed by my staff.   Blane Ohara, MD [facility] [phone]

## 2023-06-29 DIAGNOSIS — R1030 Lower abdominal pain, unspecified: Secondary | ICD-10-CM | POA: Insufficient documentation

## 2023-06-29 DIAGNOSIS — R1084 Generalized abdominal pain: Secondary | ICD-10-CM | POA: Insufficient documentation

## 2023-06-29 NOTE — Assessment & Plan Note (Addendum)
CT scan abdomen/pelvis w/contrast to be scheduled stat. Unable to get the same day, so called to get stat in am.  Unfortunately, I could not get it on Wednesday and I sent to ED for evaluation.   Check labs

## 2023-07-04 ENCOUNTER — Encounter: Payer: Self-pay | Admitting: Family Medicine

## 2023-07-04 DIAGNOSIS — E860 Dehydration: Secondary | ICD-10-CM | POA: Insufficient documentation

## 2023-07-04 DIAGNOSIS — R197 Diarrhea, unspecified: Secondary | ICD-10-CM | POA: Insufficient documentation

## 2023-07-04 DIAGNOSIS — R630 Anorexia: Secondary | ICD-10-CM | POA: Insufficient documentation

## 2023-07-04 DIAGNOSIS — R7989 Other specified abnormal findings of blood chemistry: Secondary | ICD-10-CM | POA: Insufficient documentation

## 2023-07-04 NOTE — Assessment & Plan Note (Signed)
Discontinue lactulose. Discontinue dulcolax. Discontinue miralax.

## 2023-07-04 NOTE — Assessment & Plan Note (Signed)
Check labs.  Check ct scan abd/pelvis with contrast.

## 2023-07-04 NOTE — Assessment & Plan Note (Signed)
Check labs. Check ct of abd/pelvis with contrast.

## 2023-07-04 NOTE — Assessment & Plan Note (Signed)
Recommend hydration

## 2023-07-06 LAB — LAB REPORT - SCANNED

## 2023-07-07 ENCOUNTER — Telehealth: Payer: Self-pay

## 2023-07-07 NOTE — Transitions of Care (Post Inpatient/ED Visit) (Unsigned)
   07/07/2023  Name: Alexandra Barajas MRN: [id number] DOB: August 05, 1951  Today's TOC FU Call Status: Today's TOC FU Call Status:: Successful TOC FU Call Completed TOC FU Call Complete Date: 07/07/23  Attempted to reach the patient regarding the most recent Inpatient/ED visit.  Follow Up Plan: Additional outreach attempts will be made to reach the patient to complete the Transitions of Care (Post Inpatient/ED visit) call.   Signature  Kandis Fantasia, LPN [facility] Health Advisor [place] [facility] You Are. We Are. [facility] Direct Dial [phone]

## 2023-07-12 ENCOUNTER — Ambulatory Visit (INDEPENDENT_AMBULATORY_CARE_PROVIDER_SITE_OTHER): Payer: Medicare Other | Admitting: Family Medicine

## 2023-07-12 VITALS — BP 120/60 | HR 76 | Temp 96.8°F | Resp 18

## 2023-07-12 DIAGNOSIS — H01001 Unspecified blepharitis right upper eyelid: Secondary | ICD-10-CM

## 2023-07-12 DIAGNOSIS — R5381 Other malaise: Secondary | ICD-10-CM

## 2023-07-12 DIAGNOSIS — R531 Weakness: Secondary | ICD-10-CM | POA: Diagnosis not present

## 2023-07-12 DIAGNOSIS — R1084 Generalized abdominal pain: Secondary | ICD-10-CM

## 2023-07-12 DIAGNOSIS — Z23 Encounter for immunization: Secondary | ICD-10-CM | POA: Diagnosis not present

## 2023-07-12 DIAGNOSIS — I119 Hypertensive heart disease without heart failure: Secondary | ICD-10-CM

## 2023-07-12 DIAGNOSIS — E871 Hypo-osmolality and hyponatremia: Secondary | ICD-10-CM | POA: Diagnosis not present

## 2023-07-12 DIAGNOSIS — E039 Hypothyroidism, unspecified: Secondary | ICD-10-CM

## 2023-07-12 DIAGNOSIS — H01004 Unspecified blepharitis left upper eyelid: Secondary | ICD-10-CM

## 2023-07-12 DIAGNOSIS — F79 Unspecified intellectual disabilities: Secondary | ICD-10-CM

## 2023-07-12 DIAGNOSIS — Z111 Encounter for screening for respiratory tuberculosis: Secondary | ICD-10-CM | POA: Diagnosis not present

## 2023-07-12 MED ORDER — CLOTRIMAZOLE-BETAMETHASONE 1-0.05 % EX CREA: 1.0000 | TOPICAL_CREAM | Freq: Two times a day (BID) | CUTANEOUS | 0 refills | Status: DC

## 2023-07-12 NOTE — Progress Notes (Signed)
Subjective:  Patient ID: Alexandra Barajas, female    DOB: 1951/03/06  Age: 72 y.o. MRN: [id number]  Chief Complaint  Patient presents with   Hospitalization Follow-up   Abdominal Pain    HPI Patient is a 72 year old white female with past medical history significant for intellectual disability, mood disorder, coronary artery disease, hyperlipidemia, chronic liver cirrhosis with chronically elevated liver function tests who was brought on September 1 2 [facility] health emergency department for abdominal pain, nausea and vomiting.  Patient was discharged on July 06, 2023.  Patient has had a significant previous workup with CT scans which were normal in the last 2 months.  The patient has been deteriorating over the last 2 to 3 months and has such been seen in the emergency department several times. Workup was negative for urinary tract infection, respiratory panel was negative, chemistry panel was significant for hyponatremia with a sodium of 127 and elevated liver enzymes AST was 216 and ALT was 114.  Troponin was negative.  Lipase was normal. Albumin 4.2.  Blood count was normal except for thrombocytopenia with platelets of 114. Abnormalities included hyponatremia, liver enzymes, and an elevated TSH to 18.1.  MRSA was negative.  Gallbladder ultrasound was done which was normal however her biliary scan was abnormal with an ejection fraction of 14%.  Dr. Lequita Halt was consulted and he did not feel like her exam was consistent with a gallbladder pathology.  Patient was treated with Carafate and antiemetics during her hospitalization and her symptoms did improve.  Her diet was advanced from a liquid diet to American Heart Association diet and seem to be tolerating this.  Patient was started nortriptyline 25 mg nightly for abdominal pain of unknown etiology.  Chronic hyponatremia: Sodium is 126-128.  Prozac was decreased from 80 to 40 mg daily to see if the SSRI was contributing to this.  Patient was  given IV normal saline and salt tablets.  She was also discharged on salt tablets 1 g twice daily..  Hypothyroidism: Levothyroxine was increased from 50 mcg to 75 mcg.   To caretakers at the group home are very concerned because [name] is very weak.  She is requiring significant assistance with her ADLs.  They believe she needs to be in a skilled nursing facility.     07/12/2023    2:29 PM 05/10/2023    8:24 AM 01/03/2023    2:24 PM 01/03/2023    2:05 PM 08/03/2022    9:46 AM  Depression screen PHQ 2/9  Decreased Interest 0 0 0 0 0  Down, Depressed, Hopeless 0 0 0 0 0  PHQ - 2 Score 0 0 0 0 0  Altered sleeping   0    Tired, decreased energy   0    Change in appetite   0    Feeling bad or failure about yourself    0    Trouble concentrating   0    Moving slowly or fidgety/restless   0    Suicidal thoughts   0    PHQ-9 Score   0    Difficult doing work/chores   Not difficult at all          07/12/2023    2:28 PM  Fall Risk   Falls in the past year? 1  Number falls in past yr: 1  Injury with Fall? 0  Risk for fall due to : History of fall(s);Impaired mobility;Impaired balance/gait  Follow up Falls evaluation completed;Falls prevention discussed  Patient Care Team: Blane Ohara, MD as PCP - General (Family Medicine) Diamond Nickel., MD as Referring Physician (Cardiology)   Review of Systems  Constitutional:  Positive for fatigue. Negative for chills and fever.  HENT:  Negative for congestion, rhinorrhea and sore throat.   Respiratory:  Negative for cough and shortness of breath.   Cardiovascular:  Negative for chest pain.  Gastrointestinal:  Positive for abdominal pain. Negative for constipation, diarrhea, nausea and vomiting.  Genitourinary:  Negative for dysuria and urgency.  Musculoskeletal:  Negative for back pain and myalgias.  Skin:  Positive for rash.  Neurological:  Positive for weakness. Negative for dizziness, light-headedness and headaches.  Psychiatric/Behavioral:   Negative for dysphoric mood. The patient is not nervous/anxious.     Current Outpatient Medications on File Prior to Visit  Medication Sig Dispense Refill   FLUoxetine (PROZAC) 40 MG capsule Take 40 mg by mouth daily.     levothyroxine (SYNTHROID) 75 MCG tablet Take 75 mcg by mouth daily before breakfast.     nortriptyline (PAMELOR) 25 MG capsule Take 25 mg by mouth at bedtime.     sodium chloride 1 g tablet Take 1 g by mouth in the morning and at bedtime.     alendronate (FOSAMAX) 70 MG tablet Take 1 tab by mouth on same day each week w/8oz water, remain upright & no food/drink for *Med will not cycle, facility to reorder* 4 tablet 11   amiodarone (PACERONE) 200 MG tablet Take 1 tablet by mouth daily.     apixaban (ELIQUIS) 5 MG TABS tablet Take by mouth.     chlorhexidine (PERIDEX) 0.12 % solution RINSE WITH 1 TEASPOONFUL (5 ML) EVERY MORNING 473 mL 11   Docusate Sodium (DSS) 100 MG CAPS Take 1 capsule (100 mg total) by mouth daily. 30 capsule 11   estradiol (ESTRACE VAGINAL) 0.1 MG/GM vaginal cream Apply externally daily x 14 days, then decrease to twice a week. 42.5 g 12   ezetimibe (ZETIA) 10 MG tablet TAKE 1 TABLET BY MOUTH ONCE DAILY 30 tablet 11   furosemide (LASIX) 20 MG tablet      hydrochlorothiazide (HYDRODIURIL) 12.5 MG tablet TAKE 1 TABLET BY MOUTH ONCE DAILY 30 tablet 11   lactulose (CHRONULAC) 10 GM/15ML solution TAKE 15 ML BY MOUTH ONCE DAILY 473 mL 11   linaclotide (LINZESS) 72 MCG capsule Take 72 mcg by mouth as needed (Constipation).     loratadine (CLARITIN) 10 MG tablet Take by mouth.     losartan (COZAAR) 50 MG tablet TAKE 1 TABLET BY MOUTH ONCE DAILY 30 tablet 11   magnesium oxide (MAG-OX) 400 MG tablet Take by mouth.     metoprolol succinate (TOPROL-XL) 25 MG 24 hr tablet TAKE 1 TABLET BY MOUTH ONCE DAILY 30 tablet 11   omeprazole (PRILOSEC) 40 MG capsule TAKE 1 CAPSULE BY MOUTH ONCE DAILY 30 capsule 11   ondansetron (ZOFRAN) 4 MG tablet Take 4 mg by mouth  every 8 (eight) hours as needed.     polyethylene glycol powder (GLYCOLAX/MIRALAX) 17 GM/SCOOP powder Take 17 g by mouth 2 (two) times daily. 510 g 11   PRALUENT 75 MG/ML SOAJ Inject 1 Syringe into the skin every 14 (fourteen) days.     Prenatal Vit-Fe Fumarate-FA (MULTIVITAMIN-PRENATAL) 27-0.8 MG TABS tablet Take 1 tablet by mouth daily.     ranolazine (RANEXA) 500 MG 12 hr tablet TAKE 1 TABLET BY MOUTH TWICE DAILY * DO NOT CRUSH * 20 tablet 11   triamcinolone  cream (KENALOG) 0.1 % Apply twice daily x 1 week to rash on leg, then twice a day as needed for itchy rash. 45 g 0   Wheat Dextrin (BENEFIBER) POWD Stir 2 teaspoons of Benefiber into 4-8 oz of your favorite non-carbonated beverage or soft food (hot or cold). Stir well until dissolved. 2 teaspoons three times daily. Do not exceed 6 teaspoons per day. 730 g 11   No current facility-administered medications on file prior to visit.   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    Atrophy of thyroid    Coronary artery disease 2003   cabg   Depression    Hyperlipidemia    Hypertension    Hypertensive heart disease without heart failure    Moderate intellectual disabilities    Osteoporosis    Other nonrheumatic aortic valve disorders    Other specified disorders of bone density and structure, multiple sites    Postmenopausal bleeding 05/15/2023   Presence of aortocoronary bypass graft    Presence of prosthetic heart valve    Past Surgical History:  Procedure Laterality Date   ABDOMINAL HYSTERECTOMY     BL oophorectomy   ANKLE SURGERY Left    CARDIAC VALVE REPLACEMENT     2003 (aortic valve and mitral valve replacements.)   CORONARY ARTERY BYPASS GRAFT      Family History  Problem Relation Age of Onset   Breast cancer Other    Social History   Socioeconomic History   Marital status: Single     Spouse name: Not on file   Number of children: Not on file   Years of education: Not on file   Highest education level: Not on file  Occupational History   Not on file  Tobacco Use   Smoking status: Never   Smokeless tobacco: Never  Substance and Sexual Activity   Alcohol use: Never   Drug use: Never   Sexual activity: Not Currently  Other Topics Concern   Not on file  Social History Narrative   Lives at [facility]   Social Determinants of Health   Financial Resource Strain: Low Risk  (01/03/2023)   Overall Financial Resource Strain (CARDIA)    Difficulty of Paying Living Expenses: Not hard at all  Food Insecurity: Low Risk  (07/16/2023)   Received from [facility]   Hunger Vital Sign    Worried About Running Out of Food in the Last Year: Never true    Ran Out of Food in the Last Year: Never true  Transportation Needs: Unknown (07/16/2023)   Received from [facility]   Transportation    In the past 12 months, has lack of reliable transportation kept you from medical appointments, meetings, work or from getting things needed for daily living? : Patient unable to answer  Physical Activity: Inactive (01/03/2023)   Exercise Vital Sign    Days of Exercise per Week: 0 days    Minutes of Exercise per Session: 0 min  Stress: No Stress Concern Present (01/03/2023)   [employer] of Occupational Health - Occupational Stress Questionnaire    Feeling of Stress : Not at all  Social Connections: Socially Isolated (01/03/2023)   Social Connection and Isolation Panel [NHANES]    Frequency of Communication with Friends and Family: Three times a week    Frequency of Social Gatherings with Friends and Family: Three  times a week    Attends Religious Services: Never    Active Member of Clubs or Organizations: No    Attends Banker Meetings: Never    Marital Status: Never married    Objective:  BP 120/60   Pulse 76   Temp (!) 96.8 F (36 C)   Resp 18      07/12/2023     2:29 PM 06/27/2023    3:24 PM 05/10/2023    8:14 AM  BP/Weight  Systolic BP 120 110 110  Diastolic BP 60 70 60  Wt. (Lbs)  157.6 157.2  BMI  25.44 kg/m2 25.37 kg/m2    Physical Exam Vitals reviewed.  Constitutional:      Appearance: Normal appearance. She is well-developed and normal weight.  Eyes:     Comments: Upper lids inversion.  Some erythema of conjunctivae.  Neck:     Vascular: No carotid bruit.  Cardiovascular:     Rate and Rhythm: Normal rate and regular rhythm.     Heart sounds: Normal heart sounds.  Pulmonary:     Effort: Pulmonary effort is normal. No respiratory distress.     Breath sounds: Normal breath sounds.  Abdominal:     General: Abdomen is flat. Bowel sounds are normal.     Palpations: Abdomen is soft.     Tenderness: There is abdominal tenderness in the periumbilical area. There is no guarding or rebound.  Neurological:     Mental Status: She is alert and oriented to person, place, and time.  Psychiatric:        Mood and Affect: Mood normal.        Behavior: Behavior normal.     Diabetic Foot Exam - Simple   No data filed      Lab Results  Component Value Date   WBC 7.6 07/12/2023   HGB 13.0 07/12/2023   HCT 39.6 07/12/2023   PLT 116 (L) 07/12/2023   GLUCOSE 101 (H) 07/12/2023   CHOL 175 05/10/2023   TRIG 125 05/10/2023   HDL 59 05/10/2023   LDLCALC 94 05/10/2023   ALT 69 (H) 07/12/2023   AST 118 (H) 07/12/2023   NA 136 07/12/2023   K 4.1 07/12/2023   CL 96 07/12/2023   CREATININE 0.85 07/12/2023   BUN 15 07/12/2023   CO2 26 07/12/2023   TSH 3.540 01/07/2023   HGBA1C 6.0 (H) 05/10/2023      Assessment & Plan:    Abdominal pain, generalized Assessment & Plan: Referring to GI.  Continue nortriptyline 25 mg nightly. I am very concerned this may be related to her gallbladder.  I will consider discussing with Dr. Lequita Halt again.  Orders: -     Ambulatory referral to Gastroenterology  Physical deconditioning Assessment &  Plan: Recommend moved to skilled nursing facility.  FL 2 filled out.   Hyponatremia Assessment & Plan: Check labs.  Continue sodium chloride 1 g twice daily.  Orders: -     Comprehensive metabolic panel  Weakness Assessment & Plan: Check labs   Blepharitis of upper eyelids of both eyes, unspecified type Assessment & Plan: Recommend baby soap twice daily to eyes.   Acquired hypothyroidism Assessment & Plan: Poorly controlled.  Recently increased to levothyroxine 75 mcg daily.  Recheck TSH and free T4 in 6 weeks.   Mental disability Assessment & Plan: Continued care at group home for now.  Will work on [employer] 2 to get admitted to a skilled nursing facility.   Hypertension with heart  disease Assessment & Plan: Well controlled.  No changes to medicines.  Continue to work on eating a healthy diet and exercise.  Labs drawn today.    Orders: -     CBC with Differential/Platelet  Tuberculosis screening -     TB Skin Test  Encounter for immunization -     Flu Vaccine Trivalent High Dose (Fluad)  Other orders -     Clotrimazole-Betamethasone; Apply 1 Application topically 2 (two) times daily.  Dispense: 90 g; Refill: 0     Meds ordered this encounter  Medications   clotrimazole-betamethasone (LOTRISONE) cream    Sig: Apply 1 Application topically 2 (two) times daily.    Dispense:  90 g    Refill:  0    Orders Placed This Encounter  Procedures   Flu Vaccine Trivalent High Dose (Fluad)   CBC with Differential/Platelet   Comprehensive metabolic panel   Ambulatory referral to Gastroenterology   TB Skin Test     Follow-up: No follow-ups on file.   I,Carolyn M Morrison,acting as a Neurosurgeon for Blane Ohara, MD.,have documented all relevant documentation on the behalf of Blane Ohara, MD,as directed by  Blane Ohara, MD while in the presence of Blane Ohara, MD.   An After Visit Summary was printed and given to the patient.  Blane Ohara, MD [facility] [phone]

## 2023-07-13 LAB — COMPREHENSIVE METABOLIC PANEL
ALT: 69 IU/L — ABNORMAL HIGH (ref 0–32)
AST: 118 IU/L — ABNORMAL HIGH (ref 0–40)
Albumin: 4 g/dL (ref 3.8–4.8)
Alkaline Phosphatase: 119 IU/L (ref 44–121)
BUN/Creatinine Ratio: 18 (ref 12–28)
BUN: 15 mg/dL (ref 8–27)
Bilirubin Total: 0.8 mg/dL (ref 0.0–1.2)
CO2: 26 mmol/L (ref 20–29)
Calcium: 9.2 mg/dL (ref 8.7–10.3)
Chloride: 96 mmol/L (ref 96–106)
Creatinine, Ser: 0.85 mg/dL (ref 0.57–1.00)
Globulin, Total: 2.6 g/dL (ref 1.5–4.5)
Glucose: 101 mg/dL — ABNORMAL HIGH (ref 70–99)
Potassium: 4.1 mmol/L (ref 3.5–5.2)
Sodium: 136 mmol/L (ref 134–144)
Total Protein: 6.6 g/dL (ref 6.0–8.5)
eGFR: 72 mL/min/{1.73_m2} (ref >59)

## 2023-07-13 LAB — CBC WITH DIFFERENTIAL/PLATELET
Basophils Absolute: 0 10*3/uL (ref 0.0–0.2)
Basos: 1 %
EOS (ABSOLUTE): 0.2 10*3/uL (ref 0.0–0.4)
Eos: 2 %
Hematocrit: 39.6 % (ref 34.0–46.6)
Hemoglobin: 13 g/dL (ref 11.1–15.9)
Immature Grans (Abs): 0 10*3/uL (ref 0.0–0.1)
Immature Granulocytes: 1 %
Lymphocytes Absolute: 1.7 10*3/uL (ref 0.7–3.1)
Lymphs: 22 %
MCH: 31.4 pg (ref 26.6–33.0)
MCHC: 32.8 g/dL (ref 31.5–35.7)
MCV: 96 fL (ref 79–97)
Monocytes Absolute: 0.9 10*3/uL (ref 0.1–0.9)
Monocytes: 12 %
Neutrophils Absolute: 4.7 10*3/uL (ref 1.4–7.0)
Neutrophils: 62 %
Platelets: 116 10*3/uL — ABNORMAL LOW (ref 150–450)
RBC: 4.14 x10E6/uL (ref 3.77–5.28)
RDW: 12.5 % (ref 11.7–15.4)
WBC: 7.6 10*3/uL (ref 3.4–10.8)

## 2023-07-14 ENCOUNTER — Ambulatory Visit: Payer: Medicare Other

## 2023-07-14 DIAGNOSIS — Z111 Encounter for screening for respiratory tuberculosis: Secondary | ICD-10-CM

## 2023-07-14 NOTE — Progress Notes (Signed)
Patient is in office today for a nurse visit for reading of PPD.  PPD negative with 0 mm.  No further evaluation needed.

## 2023-07-16 DIAGNOSIS — G9340 Encephalopathy, unspecified: Secondary | ICD-10-CM | POA: Insufficient documentation

## 2023-07-16 DIAGNOSIS — R1084 Generalized abdominal pain: Secondary | ICD-10-CM | POA: Insufficient documentation

## 2023-07-16 DIAGNOSIS — R5381 Other malaise: Secondary | ICD-10-CM | POA: Insufficient documentation

## 2023-07-16 DIAGNOSIS — H01001 Unspecified blepharitis right upper eyelid: Secondary | ICD-10-CM | POA: Insufficient documentation

## 2023-07-16 DIAGNOSIS — R531 Weakness: Secondary | ICD-10-CM | POA: Insufficient documentation

## 2023-07-16 NOTE — Assessment & Plan Note (Addendum)
Poorly controlled.  Recently increased to levothyroxine 75 mcg daily.  Recheck TSH and free T4 in 6 weeks.

## 2023-07-16 NOTE — Assessment & Plan Note (Signed)
Well controlled.  ?No changes to medicines.  ?Continue to work on eating a healthy diet and exercise.  ?Labs drawn today.  ?

## 2023-07-16 NOTE — Assessment & Plan Note (Addendum)
Check labs.  Continue sodium chloride 1 g twice daily.

## 2023-07-16 NOTE — Assessment & Plan Note (Signed)
Check labs 

## 2023-07-16 NOTE — Assessment & Plan Note (Addendum)
Referring to GI.  Continue nortriptyline 25 mg nightly. I am very concerned this may be related to her gallbladder.  I will consider discussing with Dr. Lequita Halt again.

## 2023-07-16 NOTE — Assessment & Plan Note (Addendum)
Continued care at group home for now.  Will work on [employer] 2 to get admitted to a skilled nursing facility.

## 2023-07-19 ENCOUNTER — Telehealth: Payer: Self-pay

## 2023-07-19 NOTE — Telephone Encounter (Signed)
[facility] called and stated that a new FL2 was suppose to be filled out for a higher level of care. Need fax as soon as possible to [phone]

## 2023-07-21 ENCOUNTER — Encounter: Payer: Self-pay | Admitting: Family Medicine

## 2023-07-21 ENCOUNTER — Telehealth: Payer: Self-pay

## 2023-07-21 NOTE — Telephone Encounter (Signed)
Spoke with Alexandra Barajas patient was discharged to the group home, They still need the [facility] for high level of care, and also need a hospital follow up visit, where would you like me to schedule them you have noting for the next two weeks.

## 2023-07-21 NOTE — Transitions of Care (Post Inpatient/ED Visit) (Signed)
07/21/2023  Name: Alexandra Barajas MRN: [id number] DOB: [birth year]  Today's TOC FU Call Status: Today's TOC FU Call Status:: Successful TOC FU Call Completed TOC FU Call Complete Date: 07/21/23 Patient's Name and Date of Birth confirmed.  Transition Care Management Follow-up Telephone Call Date of Discharge: 07/20/23 Discharge Facility: [facility] Type of Discharge: Inpatient Admission Primary Inpatient Discharge Diagnosis:: cystitis How have you been since you were released from the hospital?: Better Any questions or concerns?: No  Items Reviewed: Did you receive and understand the discharge instructions provided?: Yes Medications obtained,verified, and reconciled?: Yes (Medications Reviewed) Any new allergies since your discharge?: No Dietary orders reviewed?: Yes Do you have support at home?: Yes People in Home: facility resident  Medications Reviewed Today: Medications Reviewed Today     Reviewed by Karena Addison, LPN (Licensed Practical Nurse) on 07/21/23 at 0933  Med List Status: <None>   Medication Order Taking? Sig Documenting Provider Last Dose Status Informant  alendronate (FOSAMAX) 70 MG tablet [id number]  Take 1 tab by mouth on same day each week w/8oz water, remain upright & no food/drink for *Med will not cycle, facility to reorderBlane Ohara, MD  Active   amiodarone (PACERONE) 200 MG tablet [id number] No Take 1 tablet by mouth daily. [provider] Taking Active   apixaban (ELIQUIS) 5 MG TABS tablet [id number] No Take by mouth. [provider] Taking Active   chlorhexidine (PERIDEX) 0.12 % solution [id number] No RINSE WITH 1 TEASPOONFUL (5 ML) EVERY MORNING Cox, Kirsten, MD Taking Active   clotrimazole-betamethasone (LOTRISONE) cream [id number]  Apply 1 Application topically 2 (two) times daily. Cox, Kirsten, MD  Active   Docusate Sodium (DSS) 100 MG CAPS [id number]  Take 1 capsule (100 mg total) by mouth daily. Cox, Kirsten, MD   Active   estradiol (ESTRACE VAGINAL) 0.1 MG/GM vaginal cream [id number]  Apply externally daily x 14 days, then decrease to twice a week. Cox, Kirsten, MD  Active   ezetimibe (ZETIA) 10 MG tablet [id number]  TAKE 1 TABLET BY MOUTH ONCE DAILY Cox, Kirsten, MD  Active   FLUoxetine (PROZAC) 40 MG capsule [id number]  Take 40 mg by mouth daily. [provider]  Active   furosemide (LASIX) 20 MG tablet [id number]   [provider]  Active   hydrochlorothiazide (HYDRODIURIL) 12.5 MG tablet [id number]  TAKE 1 TABLET BY MOUTH ONCE DAILY Cox, Kirsten, MD  Active   lactulose (CHRONULAC) 10 GM/15ML solution [id number] No TAKE 15 ML BY MOUTH ONCE DAILY Cox, Kirsten, MD Taking Active   levothyroxine (SYNTHROID) 75 MCG tablet [id number]  Take 75 mcg by mouth daily before breakfast. [provider]  Active   linaclotide (LINZESS) 72 MCG capsule [id number]  Take 72 mcg by mouth as needed (Constipation). [provider]  Active   loratadine (CLARITIN) 10 MG tablet [id number] No Take by mouth. [provider] Taking Active   losartan (COZAAR) 50 MG tablet [id number]  TAKE 1 TABLET BY MOUTH ONCE DAILY Cox, Kirsten, MD  Active   magnesium oxide (MAG-OX) 400 MG tablet [id number]  Take by mouth. [provider]  Active   metoprolol succinate (TOPROL-XL) 25 MG 24 hr tablet [id number]  TAKE 1 TABLET BY MOUTH ONCE DAILY Cox, Kirsten, MD  Active   nortriptyline (PAMELOR) 25 MG capsule [id number]  Take 25 mg by mouth at bedtime. [provider]  Active   omeprazole (PRILOSEC) 40 MG capsule [id number]  TAKE 1 CAPSULE BY MOUTH ONCE DAILY Cox, Kirsten, MD  Active   ondansetron (ZOFRAN) 4 MG tablet [id number] No Take 4 mg by mouth every 8 (eight) hours as needed. [provider] Taking Active   polyethylene glycol powder (GLYCOLAX/MIRALAX) 17 GM/SCOOP powder [id number]  Take 17 g by mouth 2 (two) times daily. Cox, Kirsten, MD  Active   PRALUENT 75 MG/ML Ivory Broad [id number] No  Inject 1 Syringe into the skin every 14 (fourteen) days. [provider] Taking Active   Prenatal Vit-Fe Fumarate-FA (MULTIVITAMIN-PRENATAL) 27-0.8 MG TABS tablet [id number]  Take 1 tablet by mouth daily. [provider]  Active   ranolazine (RANEXA) 500 MG 12 hr tablet [id number]  TAKE 1 TABLET BY MOUTH TWICE DAILY * DO NOT CRUSH * Cox, Kirsten, MD  Active   sodium chloride 1 g tablet [id number]  Take 1 g by mouth in the morning and at bedtime. [provider]  Active   triamcinolone cream (KENALOG) 0.1 % [id number]  Apply twice daily x 1 week to rash on leg, then twice a day as needed for itchy rash. Cox, Kirsten, MD  Active   Wheat Dextrin [facility]) POWD [id number] No Stir 2 teaspoons of Benefiber into 4-8 oz of your favorite non-carbonated beverage or soft food (hot or cold). Stir well until dissolved. 2 teaspoons three times daily. Do not exceed 6 teaspoons per day. Cox, Kirsten, MD Taking Active             Home Care and Equipment/Supplies: Were Home Health Services Ordered?: NA Any new equipment or medical supplies ordered?: NA  Functional Questionnaire: Do you need assistance with bathing/showering or dressing?: No Do you need assistance with meal preparation?: No Do you need assistance with eating?: No Do you have difficulty maintaining continence: No Do you need assistance with getting out of bed/getting out of a chair/moving?: No Do you have difficulty managing or taking your medications?: No  Follow up appointments reviewed: PCP Follow-up appointment confirmed?: No (no avail appt times, sent message to staff to schedule) MD Provider Line Number:[phone] Given: No Specialist Hospital Follow-up appointment confirmed?: NA Do you need transportation to your follow-up appointment?: No Do you understand care options if your condition(s) worsen?: Yes-patient verbalized understanding    SIGNATURE Karena Addison, LPN [facility] Nurse Health  Advisor Direct Dial [phone]

## 2023-07-24 ENCOUNTER — Encounter: Payer: Self-pay | Admitting: Family Medicine

## 2023-07-24 DIAGNOSIS — Z23 Encounter for immunization: Secondary | ICD-10-CM | POA: Insufficient documentation

## 2023-07-24 DIAGNOSIS — Z111 Encounter for screening for respiratory tuberculosis: Secondary | ICD-10-CM | POA: Insufficient documentation

## 2023-07-24 LAB — LAB REPORT - SCANNED: EGFR: 87

## 2023-07-24 NOTE — Assessment & Plan Note (Signed)
Recommend moved to skilled nursing facility.  FL 2 filled out.

## 2023-07-24 NOTE — Assessment & Plan Note (Signed)
Recommend baby soap twice daily to eyes.

## 2023-07-26 ENCOUNTER — Other Ambulatory Visit: Payer: Self-pay

## 2023-07-28 ENCOUNTER — Ambulatory Visit (INDEPENDENT_AMBULATORY_CARE_PROVIDER_SITE_OTHER): Payer: Medicare Other

## 2023-07-28 VITALS — BP 110/60 | HR 91 | Temp 97.4°F | Resp 18 | Ht 66.0 in | Wt 171.0 lb

## 2023-07-28 DIAGNOSIS — E871 Hypo-osmolality and hyponatremia: Secondary | ICD-10-CM

## 2023-07-28 DIAGNOSIS — I4819 Other persistent atrial fibrillation: Secondary | ICD-10-CM | POA: Diagnosis not present

## 2023-07-28 DIAGNOSIS — R0602 Shortness of breath: Secondary | ICD-10-CM

## 2023-07-28 DIAGNOSIS — Z9889 Other specified postprocedural states: Secondary | ICD-10-CM

## 2023-07-28 DIAGNOSIS — R109 Unspecified abdominal pain: Secondary | ICD-10-CM | POA: Diagnosis not present

## 2023-07-28 DIAGNOSIS — G8929 Other chronic pain: Secondary | ICD-10-CM | POA: Insufficient documentation

## 2023-07-28 DIAGNOSIS — E039 Hypothyroidism, unspecified: Secondary | ICD-10-CM

## 2023-07-28 NOTE — Assessment & Plan Note (Addendum)
Her TSH was noted to be elevated, so her levothyroxine was increased from 50 mcg daily to 75 mcg daily with recommendation to have a repeat TSH in 6 weeks.  Since this change was made in the beginning of September, she can go ahead and have the TSH done in less than a week from now

## 2023-07-28 NOTE — Assessment & Plan Note (Addendum)
Of note she has atrial fibrillation for which she is on Eliquis and amiodarone. Hence she was deemed to be a poor candidate for any surgical intervention for her gallbladder due to her comorbidities.  She is in A-fib today.

## 2023-07-28 NOTE — Assessment & Plan Note (Addendum)
   During her hospitalization, she was also noted to be having hyponatremia  Which appeared to be chronic, but the dose of her Prozac was decreased from 80 mg to 40 mg due to the hyponatremia.  She was started on sodium chloride tablets 1 g twice daily.  Her last sodium as of September 10 had improved to 136.  Plan: Will order repeat metabolic profile to check sodium level.

## 2023-07-28 NOTE — Assessment & Plan Note (Addendum)
Multifactorial etiology.  She has bilateral crackles and evidence of pleural effusion on her prior imaging. She has hyponatremia, and general fluid overload, with a suspicion for anasarca.  Her dose of Lasix was already increased.  Advised to watch her fluid intake, use compression stockings for her bilateral leg swellings.  I DID SPEAK TO [name], PATIENT'S GUARDIAN ON PHONE, EXPLAINED Alexandra Barajas'S CONDITION AND CURRENT HEALTH STATUS.  ADVISED AND PROVIDED ADVANCED DIRECTIVE PAPERWORK, TO FILL.,

## 2023-07-28 NOTE — Assessment & Plan Note (Signed)
7

## 2023-07-28 NOTE — Assessment & Plan Note (Addendum)
Could be multifactorial, could be due to gallbladder dyskinesia or chronic constipation or her ascites due to cirrhosis of liver.  Plan: No intervention at this time. Could take Tylenol as needed.

## 2023-07-28 NOTE — Patient Instructions (Signed)
Will order labs to follow up on her sodium level, other kidney function, liver numbers etc Please do the blood work on Tuesday Paperwork for advance directives provided Goals of care discussed. Recommend compression stockings and leg elevation Follow up here in office in 4 weeks

## 2023-07-28 NOTE — Progress Notes (Signed)
Subjective:  Patient ID: Alexandra Barajas, female    DOB: [birth year]  Age: 72 y.o. MRN: [id number]  Chief Complaint  Patient presents with   Shortness of Breath   Fatigue    HPI   She went to [facility] on 07/06/2023 for SOB, fatigue, lack appetite, and she was not feeling good. They changed some medicine   Orionna is brought in for hospital discharge follow-up. She was admitted to the [facility] from July 03, 2023 till July 06, 2023 for reported recurrent abdominal pain.. She has a history of intellectual disability, resident of group home.  Looks like she is a poor historian. There is mention of reported upper and lower abdominal pain, workup in the hospital revealed elevated transaminases and CAT scan showed findings suggestive of cirrhosis and some ascites. Gallbladder ultrasound ruled gallstones or gallbladder wall thickening but the HIDA scan showed decreased gallbladder ejection fraction of 14%.  There was a small concern that her abdominal pain could be biliary dyskinesia but there was no definitive diagnosis.   She was recommended to be discharged to the residential group home in a stable condition.    Nortriptyline 25 mg was added to take once daily at bedtime.      07/12/2023    2:29 PM 05/10/2023    8:24 AM 01/03/2023    2:24 PM 01/03/2023    2:05 PM 08/03/2022    9:46 AM  Depression screen PHQ 2/9  Decreased Interest 0 0 0 0 0  Down, Depressed, Hopeless 0 0 0 0 0  PHQ - 2 Score 0 0 0 0 0  Altered sleeping   0    Tired, decreased energy   0    Change in appetite   0    Feeling bad or failure about yourself    0    Trouble concentrating   0    Moving slowly or fidgety/restless   0    Suicidal thoughts   0    PHQ-9 Score   0    Difficult doing work/chores   Not difficult at all          07/12/2023    2:28 PM  Fall Risk   Falls in the past year? 1  Number falls in past yr: 1  Injury with Fall? 0  Risk for fall due to : History of  fall(s);Impaired mobility;Impaired balance/gait  Follow up Falls evaluation completed;Falls prevention discussed    Patient Care Team: Blane Ohara, MD as PCP - General (Family Medicine) Diamond Nickel., MD as Referring Physician (Cardiology)   Review of Systems  Constitutional:  Negative for chills, fatigue and fever.       Review of systems provided by the caregiver.  Patient herself only complains of some vague abdominal pain.  HENT:  Negative for congestion, ear pain and sore throat.   Respiratory:  Positive for shortness of breath. Negative for cough.   Cardiovascular:  Positive for chest pain. Negative for palpitations.  Gastrointestinal:  Negative for abdominal pain, constipation, diarrhea, nausea and vomiting.  Endocrine: Negative for polydipsia, polyphagia and polyuria.  Genitourinary:  Negative for difficulty urinating and dysuria.  Musculoskeletal:  Negative for arthralgias and myalgias.  Skin:  Negative for rash.  Neurological:  Negative for headaches.  Psychiatric/Behavioral:  Negative for dysphoric mood. The patient is not nervous/anxious.     Current Outpatient Medications on File Prior to Visit  Medication Sig Dispense Refill   alendronate (FOSAMAX) 70 MG tablet Take 1 tab  by mouth on same day each week w/8oz water, remain upright & no food/drink for *Med will not cycle, facility to reorder* 4 tablet 11   apixaban (ELIQUIS) 5 MG TABS tablet Take by mouth.     chlorhexidine (PERIDEX) 0.12 % solution RINSE WITH 1 TEASPOONFUL (5 ML) EVERY MORNING 473 mL 11   clotrimazole-betamethasone (LOTRISONE) cream Apply 1 Application topically 2 (two) times daily. 90 g 0   Docusate Sodium (DSS) 100 MG CAPS Take 1 capsule (100 mg total) by mouth daily. 30 capsule 11   estradiol (ESTRACE VAGINAL) 0.1 MG/GM vaginal cream Apply externally daily x 14 days, then decrease to twice a week. 42.5 g 12   ezetimibe (ZETIA) 10 MG tablet TAKE 1 TABLET BY MOUTH ONCE DAILY 30 tablet 11    FLUoxetine (PROZAC) 40 MG capsule Take 40 mg by mouth daily.     furosemide (LASIX) 20 MG tablet      hydrochlorothiazide (HYDRODIURIL) 12.5 MG tablet TAKE 1 TABLET BY MOUTH ONCE DAILY 30 tablet 11   levothyroxine (SYNTHROID) 75 MCG tablet Take 75 mcg by mouth daily before breakfast.     linaclotide (LINZESS) 72 MCG capsule Take 72 mcg by mouth as needed (Constipation).     loratadine (CLARITIN) 10 MG tablet Take by mouth.     losartan (COZAAR) 50 MG tablet TAKE 1 TABLET BY MOUTH ONCE DAILY 30 tablet 11   magnesium oxide (MAG-OX) 400 MG tablet Take by mouth.     metoprolol succinate (TOPROL-XL) 25 MG 24 hr tablet TAKE 1 TABLET BY MOUTH ONCE DAILY (Patient taking differently: Take 37.5 mg by mouth daily. TAKE 1.5 TABLET BY MOUTH ONCE DAILY) 30 tablet 11   nortriptyline (PAMELOR) 25 MG capsule Take 25 mg by mouth at bedtime.     omeprazole (PRILOSEC) 40 MG capsule TAKE 1 CAPSULE BY MOUTH ONCE DAILY 30 capsule 11   ondansetron (ZOFRAN) 4 MG tablet Take 4 mg by mouth every 8 (eight) hours as needed.     PRALUENT 75 MG/ML SOAJ Inject 1 Syringe into the skin every 14 (fourteen) days.     Prenatal Vit-Fe Fumarate-FA (MULTIVITAMIN-PRENATAL) 27-0.8 MG TABS tablet Take 1 tablet by mouth daily.     ranolazine (RANEXA) 500 MG 12 hr tablet TAKE 1 TABLET BY MOUTH TWICE DAILY * DO NOT CRUSH * 20 tablet 11   sodium chloride 1 g tablet Take 1 g by mouth in the morning and at bedtime.     Wheat Dextrin (BENEFIBER) POWD Stir 2 teaspoons of Benefiber into 4-8 oz of your favorite non-carbonated beverage or soft food (hot or cold). Stir well until dissolved. 2 teaspoons three times daily. Do not exceed 6 teaspoons per day. 730 g 11   No current facility-administered medications on file prior to visit.   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    Atrophy of thyroid    Coronary  artery disease 2003   cabg   Depression    Hyperlipidemia    Hypertension    Hypertensive heart disease without heart failure    Moderate intellectual disabilities    Osteoporosis    Other nonrheumatic aortic valve disorders    Other specified disorders of bone density and structure, multiple sites    Postmenopausal bleeding 05/15/2023   Presence of aortocoronary bypass graft    Presence of prosthetic heart valve  Past Surgical History:  Procedure Laterality Date   ABDOMINAL HYSTERECTOMY     BL oophorectomy   ANKLE SURGERY Left    CARDIAC VALVE REPLACEMENT     2003 (aortic valve and mitral valve replacements.)   CORONARY ARTERY BYPASS GRAFT      Family History  Problem Relation Age of Onset   Breast cancer Other    Social History   Socioeconomic History   Marital status: Single    Spouse name: Not on file   Number of children: Not on file   Years of education: Not on file   Highest education level: Not on file  Occupational History   Not on file  Tobacco Use   Smoking status: Never   Smokeless tobacco: Never  Substance and Sexual Activity   Alcohol use: Never   Drug use: Never   Sexual activity: Not Currently  Other Topics Concern   Not on file  Social History Narrative   [facility] at [facility]   Social Determinants of Health   Financial Resource Strain: Low Risk  (01/03/2023)   Overall Financial Resource Strain (CARDIA)    Difficulty of Paying Living Expenses: Not hard at all  Food Insecurity: Low Risk  (07/16/2023)   Received from [facility]   Hunger Vital Sign    Worried About Running Out of Food in the Last Year: Never true    Ran Out of Food in the Last Year: Never true  Transportation Needs: Unknown (07/16/2023)   Received from [facility]   Transportation    In the past 12 months, has lack of reliable transportation kept you from medical appointments, meetings, work or from getting things needed for daily living? : Patient unable to answer   Physical Activity: Inactive (01/03/2023)   Exercise Vital Sign    Days of Exercise per Week: 0 days    Minutes of Exercise per Session: 0 min  Stress: No Stress Concern Present (01/03/2023)   [employer] of Occupational Health - Occupational Stress Questionnaire    Feeling of Stress : Not at all  Social Connections: Socially Isolated (01/03/2023)   Social Connection and Isolation Panel [NHANES]    Frequency of Communication with Friends and Family: Three times a week    Frequency of Social Gatherings with Friends and Family: Three times a week    Attends Religious Services: Never    Active Member of Clubs or Organizations: No    Attends Banker Meetings: Never    Marital Status: Never married    Objective:  BP 110/60   Pulse 91   Temp (!) 97.4 F (36.3 C)   Resp 18   Ht 5\' 6"  (1.676 m)   Wt 171 lb (77.6 kg)   SpO2 100%   BMI 27.60 kg/m      07/28/2023    3:06 PM 07/12/2023    2:29 PM 06/27/2023    3:24 PM  BP/Weight  Systolic BP 110 120 110  Diastolic BP 60 60 70  Wt. (Lbs) 171  157.6  BMI 27.6 kg/m2  25.44 kg/m2    Physical Exam HENT:     Head: Normocephalic and atraumatic.     Comments: Poor dentition noted Eyes:     Comments: Fluid bags noted around the eyes  Cardiovascular:     Rate and Rhythm: Rhythm irregular.     Heart sounds: Murmur heard.  Pulmonary:     Comments: Bilateral crackles noted in all areas of both lungs Abdominal:  Comments: Possible mild abdominal distention, no obvious tenderness noted  Musculoskeletal:     Cervical back: Normal range of motion.     Comments: Gross pitting edema, 2+ noted in both lower extremities  Neurological:     Mental Status: She is alert.  Psychiatric:        Mood and Affect: Mood normal.     Diabetic Foot Exam - Simple   No data filed      Lab Results  Component Value Date   WBC 7.6 07/12/2023   HGB 13.0 07/12/2023   HCT 39.6 07/12/2023   PLT 116 (L) 07/12/2023   GLUCOSE 101 (H)  07/12/2023   CHOL 175 05/10/2023   TRIG 125 05/10/2023   HDL 59 05/10/2023   LDLCALC 94 05/10/2023   ALT 69 (H) 07/12/2023   AST 118 (H) 07/12/2023   NA 136 07/12/2023   K 4.1 07/12/2023   CL 96 07/12/2023   CREATININE 0.85 07/12/2023   BUN 15 07/12/2023   CO2 26 07/12/2023   TSH 3.540 01/07/2023   HGBA1C 6.0 (H) 05/10/2023      Assessment & Plan:    Chronic abdominal pain Assessment & Plan: Could be multifactorial, could be due to gallbladder dyskinesia or chronic constipation or her ascites due to cirrhosis of liver.  Plan: No intervention at this time. Could take Tylenol as needed.  Orders: -     CBC with Differential/Platelet; Future  Acquired hypothyroidism Assessment & Plan: Her TSH was noted to be elevated, so her levothyroxine was increased from 50 mcg daily to 75 mcg daily with recommendation to have a repeat TSH in 6 weeks.  Since this change was made in the beginning of September, she can go ahead and have the TSH done in less than a week from now  Orders: -     TSH; Future  Hyponatremia Assessment & Plan:   During her hospitalization, she was also noted to be having hyponatremia  Which appeared to be chronic, but the dose of her Prozac was decreased from 80 mg to 40 mg due to the hyponatremia.  She was started on sodium chloride tablets 1 g twice daily.  Her last sodium as of September 10 had improved to 136.  Plan: Will order repeat metabolic profile to check sodium level.    Orders: -     Comprehensive metabolic panel; Future  Persistent atrial fibrillation [facility]) Assessment & Plan: Of note she has atrial fibrillation for which she is on Eliquis and amiodarone. Hence she was deemed to be a poor candidate for any surgical intervention for her gallbladder due to her comorbidities.  She is in A-fib today.   Shortness of breath Assessment & Plan: Multifactorial etiology.  She has bilateral crackles and evidence of pleural effusion on her prior  imaging. She has hyponatremia, and general fluid overload, with a suspicion for anasarca.  Her dose of Lasix was already increased.  Advised to watch her fluid intake, use compression stockings for her bilateral leg swellings.  I DID SPEAK TO [name], PATIENT'S GUARDIAN ON PHONE, EXPLAINED Justene'S CONDITION AND CURRENT HEALTH STATUS.  ADVISED AND PROVIDED ADVANCED DIRECTIVE PAPERWORK, TO FILL.,     S/P MVR (mitral valve repair) Assessment & Plan: 7      No orders of the defined types were placed in this encounter.   Orders Placed This Encounter  Procedures   TSH   Comprehensive metabolic panel   CBC with Differential/Platelet     Follow-up: Return in about  4 weeks (around 08/25/2023) for chronic disease follow up.  Total time spent on today's visit was greater than 45 minutes, including both face-to-face time and nonface-to-face time personally spent on review of chart (labs and imaging), discussing labs and goals, discussing further work-up, treatment options, referrals to specialist if needed, reviewing outside records of pertinent, answering patient's questions, and coordinating care.   I,Marla I Leal-Borjas,acting as a scribe for [employer], MD.,have documented all relevant documentation on the behalf of Windell Moment, MD,as directed by  Windell Moment, MD while in the presence of Windell Moment, MD.   An After Visit Summary was printed and given to the patient.  Windell Moment, MD [facility] [phone]

## 2023-08-02 ENCOUNTER — Telehealth: Payer: Self-pay

## 2023-08-02 NOTE — Telephone Encounter (Signed)
A person from the group home called and stated that this morning the patient was experiencing off balance gait and also was experiencing issues with shortness of breath and she states that the patient has been complaining of pains in her legs. I told the patient's group home worker considering the patient's history and she is short of breath and complaining of pain in her legs that the patient would need to go to the ER. Patient would need to be evaluated immediately for her Shortness of breath and due to her past medical history. Patient's Group Home worker understood verbally and stated someone would take her to the ER to be evaluated.

## 2023-08-15 ENCOUNTER — Ambulatory Visit: Payer: Medicare Other | Admitting: Family Medicine

## 2023-08-29 DIAGNOSIS — I4891 Unspecified atrial fibrillation: Secondary | ICD-10-CM | POA: Diagnosis not present

## 2023-10-02 DEATH — deceased
